# Patient Record
Sex: Male | Born: 1960 | Race: Black or African American | Hispanic: No | Marital: Married | Smoking: Never smoker
Health system: Southern US, Community
[De-identification: ages and names within clinical notes are randomized; demographics above are authoritative.]

## PROBLEM LIST (undated history)

## (undated) ENCOUNTER — Emergency Department (HOSPITAL_BASED_OUTPATIENT_CLINIC_OR_DEPARTMENT_OTHER)
Admission: EM | Disposition: A | Discharge status: Home / Self Care | Payer: Self-pay | Attending: Emergency Medicine | Admitting: Emergency Medicine

## (undated) DIAGNOSIS — I10 Essential (primary) hypertension: Secondary | ICD-10-CM

## (undated) DIAGNOSIS — F329 Major depressive disorder, single episode, unspecified: Secondary | ICD-10-CM

## (undated) DIAGNOSIS — N200 Calculus of kidney: Secondary | ICD-10-CM

## (undated) DIAGNOSIS — Z87828 Personal history of other (healed) physical injury and trauma: Secondary | ICD-10-CM

## (undated) DIAGNOSIS — D214 Benign neoplasm of connective and other soft tissue of abdomen: Secondary | ICD-10-CM

## (undated) DIAGNOSIS — K5790 Diverticulosis of intestine, part unspecified, without perforation or abscess without bleeding: Secondary | ICD-10-CM

## (undated) DIAGNOSIS — F32A Depression, unspecified: Secondary | ICD-10-CM

## (undated) DIAGNOSIS — I82409 Acute embolism and thrombosis of unspecified deep veins of unspecified lower extremity: Secondary | ICD-10-CM

## (undated) HISTORY — PX: BOWEL RESECTION: SHX1257

## (undated) HISTORY — PX: STOMACH SURGERY: SHX791

---

## 2004-07-14 ENCOUNTER — Emergency Department (HOSPITAL_COMMUNITY): Admission: EM | Admit: 2004-07-14 | Discharge: 2004-07-14 | Payer: Self-pay | Admitting: Emergency Medicine

## 2004-07-28 ENCOUNTER — Emergency Department (HOSPITAL_COMMUNITY): Admission: EM | Admit: 2004-07-28 | Discharge: 2004-07-28 | Payer: Self-pay | Admitting: Emergency Medicine

## 2004-08-07 ENCOUNTER — Emergency Department (HOSPITAL_COMMUNITY): Admission: EM | Admit: 2004-08-07 | Discharge: 2004-08-07 | Payer: Self-pay | Admitting: Emergency Medicine

## 2004-08-13 ENCOUNTER — Emergency Department: Payer: Self-pay | Admitting: Unknown Physician Specialty

## 2004-08-13 ENCOUNTER — Emergency Department (HOSPITAL_COMMUNITY): Admission: EM | Admit: 2004-08-13 | Discharge: 2004-08-13 | Payer: Self-pay | Admitting: Emergency Medicine

## 2004-08-15 ENCOUNTER — Emergency Department: Payer: Self-pay | Admitting: Emergency Medicine

## 2004-08-16 ENCOUNTER — Emergency Department (HOSPITAL_COMMUNITY): Admission: EM | Admit: 2004-08-16 | Discharge: 2004-08-16 | Payer: Self-pay | Admitting: Emergency Medicine

## 2007-04-28 ENCOUNTER — Emergency Department (HOSPITAL_COMMUNITY): Admission: EM | Admit: 2007-04-28 | Discharge: 2007-04-28 | Payer: Self-pay | Admitting: Emergency Medicine

## 2007-10-27 ENCOUNTER — Emergency Department (HOSPITAL_BASED_OUTPATIENT_CLINIC_OR_DEPARTMENT_OTHER): Admission: EM | Admit: 2007-10-27 | Discharge: 2007-10-27 | Payer: Self-pay | Admitting: Emergency Medicine

## 2007-11-18 ENCOUNTER — Emergency Department (HOSPITAL_BASED_OUTPATIENT_CLINIC_OR_DEPARTMENT_OTHER): Admission: EM | Admit: 2007-11-18 | Discharge: 2007-11-18 | Payer: Self-pay | Admitting: Emergency Medicine

## 2007-12-08 ENCOUNTER — Emergency Department (HOSPITAL_BASED_OUTPATIENT_CLINIC_OR_DEPARTMENT_OTHER): Admission: EM | Admit: 2007-12-08 | Discharge: 2007-12-08 | Payer: Self-pay | Admitting: Emergency Medicine

## 2008-01-13 ENCOUNTER — Emergency Department (HOSPITAL_BASED_OUTPATIENT_CLINIC_OR_DEPARTMENT_OTHER): Admission: EM | Admit: 2008-01-13 | Discharge: 2008-01-13 | Payer: Self-pay | Admitting: Emergency Medicine

## 2008-01-16 ENCOUNTER — Emergency Department (HOSPITAL_BASED_OUTPATIENT_CLINIC_OR_DEPARTMENT_OTHER): Admission: EM | Admit: 2008-01-16 | Discharge: 2008-01-16 | Payer: Self-pay | Admitting: Emergency Medicine

## 2008-01-25 ENCOUNTER — Ambulatory Visit: Payer: Self-pay | Admitting: Physical Medicine & Rehabilitation

## 2008-03-01 ENCOUNTER — Encounter
Admission: RE | Admit: 2008-03-01 | Discharge: 2008-03-01 | Payer: Self-pay | Admitting: Physical Medicine & Rehabilitation

## 2008-03-09 ENCOUNTER — Emergency Department (HOSPITAL_BASED_OUTPATIENT_CLINIC_OR_DEPARTMENT_OTHER): Admission: EM | Admit: 2008-03-09 | Discharge: 2008-03-09 | Payer: Self-pay | Admitting: Emergency Medicine

## 2008-07-28 ENCOUNTER — Emergency Department (HOSPITAL_BASED_OUTPATIENT_CLINIC_OR_DEPARTMENT_OTHER): Admission: EM | Admit: 2008-07-28 | Discharge: 2008-07-28 | Payer: Self-pay | Admitting: Emergency Medicine

## 2008-08-23 ENCOUNTER — Ambulatory Visit: Payer: Self-pay | Admitting: Diagnostic Radiology

## 2008-08-23 ENCOUNTER — Emergency Department (HOSPITAL_BASED_OUTPATIENT_CLINIC_OR_DEPARTMENT_OTHER): Admission: EM | Admit: 2008-08-23 | Discharge: 2008-08-23 | Payer: Self-pay | Admitting: Emergency Medicine

## 2008-10-12 ENCOUNTER — Ambulatory Visit: Payer: Self-pay | Admitting: Diagnostic Radiology

## 2008-10-12 ENCOUNTER — Emergency Department (HOSPITAL_BASED_OUTPATIENT_CLINIC_OR_DEPARTMENT_OTHER): Admission: EM | Admit: 2008-10-12 | Discharge: 2008-10-12 | Payer: Self-pay | Admitting: Emergency Medicine

## 2008-11-03 ENCOUNTER — Emergency Department (HOSPITAL_BASED_OUTPATIENT_CLINIC_OR_DEPARTMENT_OTHER): Admission: EM | Admit: 2008-11-03 | Discharge: 2008-11-03 | Payer: Self-pay | Admitting: Emergency Medicine

## 2008-11-03 ENCOUNTER — Ambulatory Visit: Payer: Self-pay | Admitting: Diagnostic Radiology

## 2009-01-21 ENCOUNTER — Ambulatory Visit: Payer: Self-pay | Admitting: Diagnostic Radiology

## 2009-01-21 ENCOUNTER — Emergency Department (HOSPITAL_BASED_OUTPATIENT_CLINIC_OR_DEPARTMENT_OTHER): Admission: EM | Admit: 2009-01-21 | Discharge: 2009-01-21 | Payer: Self-pay | Admitting: Emergency Medicine

## 2009-02-27 ENCOUNTER — Emergency Department (HOSPITAL_BASED_OUTPATIENT_CLINIC_OR_DEPARTMENT_OTHER): Admission: EM | Admit: 2009-02-27 | Discharge: 2009-02-27 | Payer: Self-pay | Admitting: Emergency Medicine

## 2009-02-27 ENCOUNTER — Ambulatory Visit: Payer: Self-pay | Admitting: Interventional Radiology

## 2009-03-01 ENCOUNTER — Ambulatory Visit: Payer: Self-pay | Admitting: Diagnostic Radiology

## 2009-03-01 ENCOUNTER — Emergency Department (HOSPITAL_BASED_OUTPATIENT_CLINIC_OR_DEPARTMENT_OTHER): Admission: EM | Admit: 2009-03-01 | Discharge: 2009-03-01 | Payer: Self-pay | Admitting: Emergency Medicine

## 2009-03-14 ENCOUNTER — Emergency Department (HOSPITAL_BASED_OUTPATIENT_CLINIC_OR_DEPARTMENT_OTHER): Admission: EM | Admit: 2009-03-14 | Discharge: 2009-03-14 | Payer: Self-pay | Admitting: Emergency Medicine

## 2009-03-14 ENCOUNTER — Ambulatory Visit: Payer: Self-pay | Admitting: Diagnostic Radiology

## 2009-04-18 ENCOUNTER — Emergency Department (HOSPITAL_BASED_OUTPATIENT_CLINIC_OR_DEPARTMENT_OTHER): Admission: EM | Admit: 2009-04-18 | Discharge: 2009-04-18 | Payer: Self-pay | Admitting: Emergency Medicine

## 2009-04-19 ENCOUNTER — Ambulatory Visit: Payer: Self-pay | Admitting: Diagnostic Radiology

## 2009-04-19 ENCOUNTER — Emergency Department (HOSPITAL_BASED_OUTPATIENT_CLINIC_OR_DEPARTMENT_OTHER): Admission: EM | Admit: 2009-04-19 | Discharge: 2009-04-19 | Payer: Self-pay | Admitting: Emergency Medicine

## 2010-01-29 IMAGING — CR DG ABDOMEN ACUTE W/ 1V CHEST
4 series · 4 of 4 positions shown · non-contrast
Comparison: 10/12/2008

CLINICAL DATA: Lower abdominal pain.  Vomiting.

ACUTE ABDOMEN SERIES (ABDOMEN 2 VIEW & CHEST 1 VIEW)

[w chest pa]
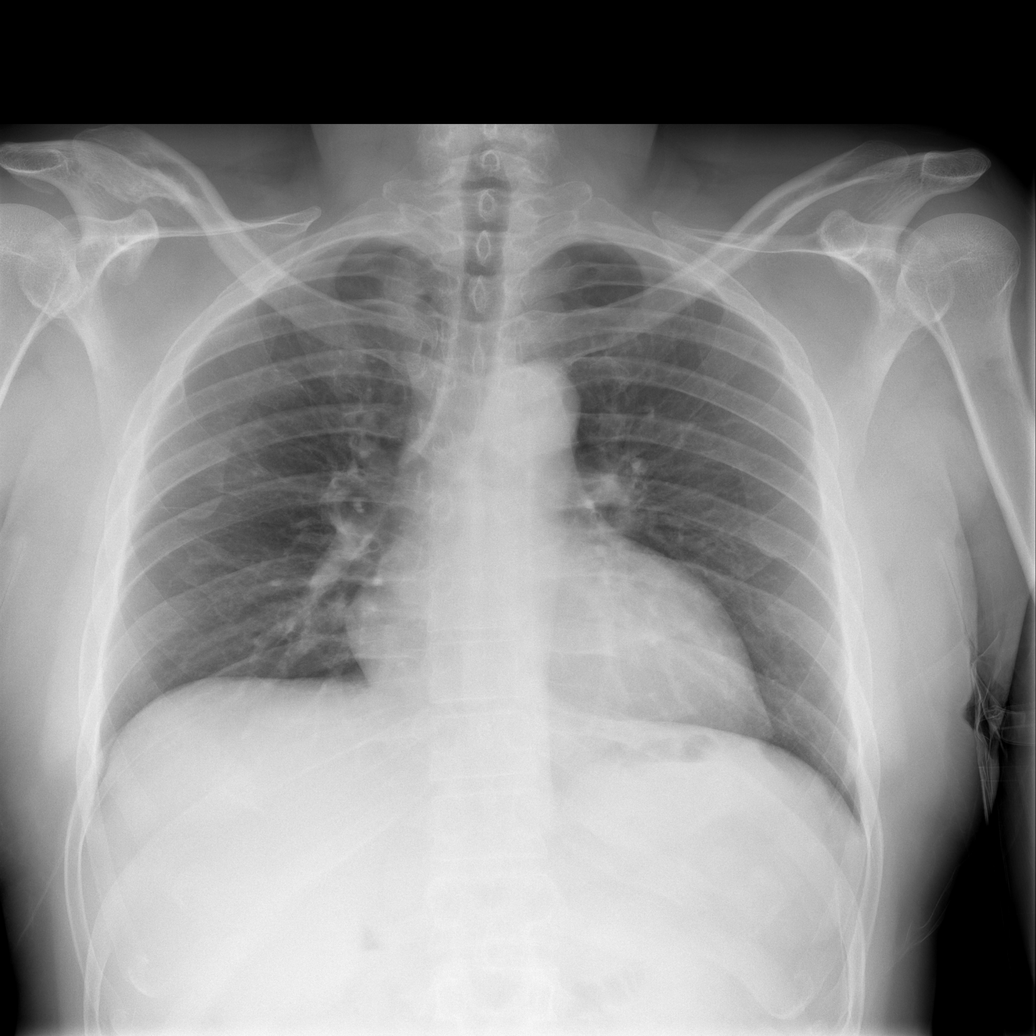

[w abdomen upright]
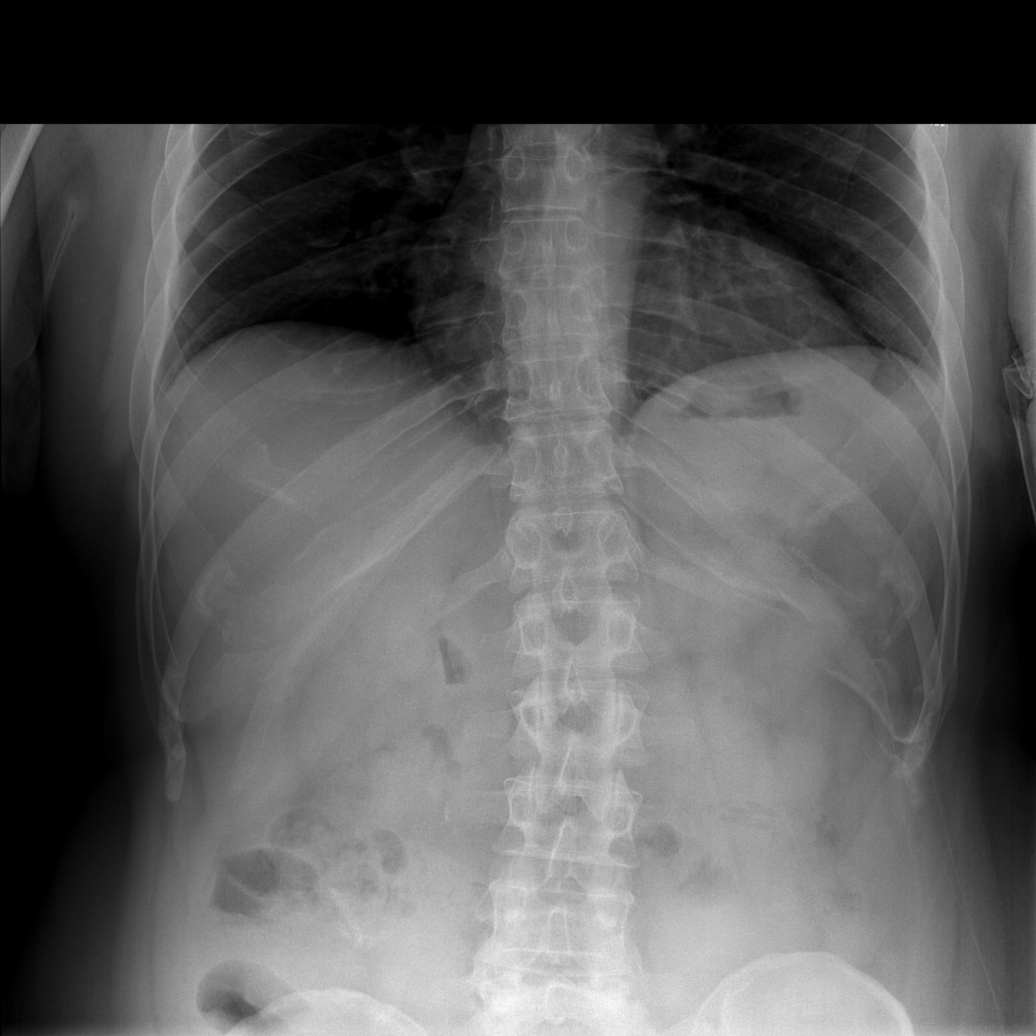

[t abdomen supine (1 of 2)]
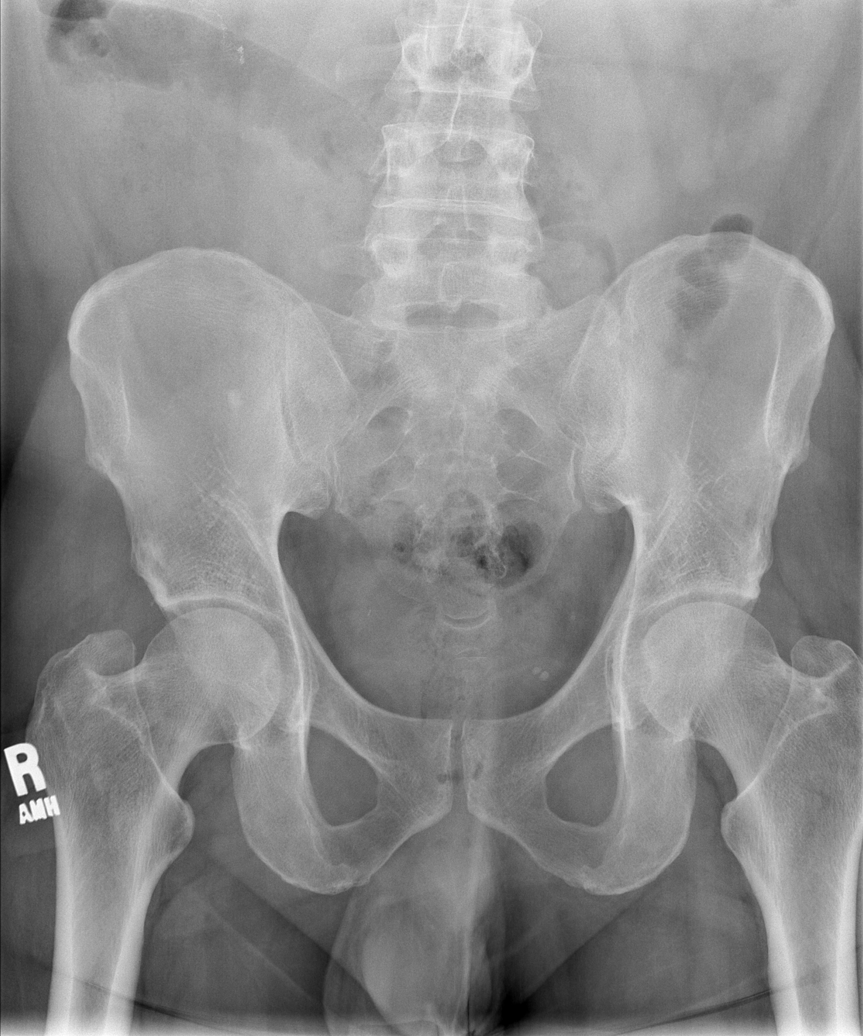

[t abdomen supine (2 of 2)]
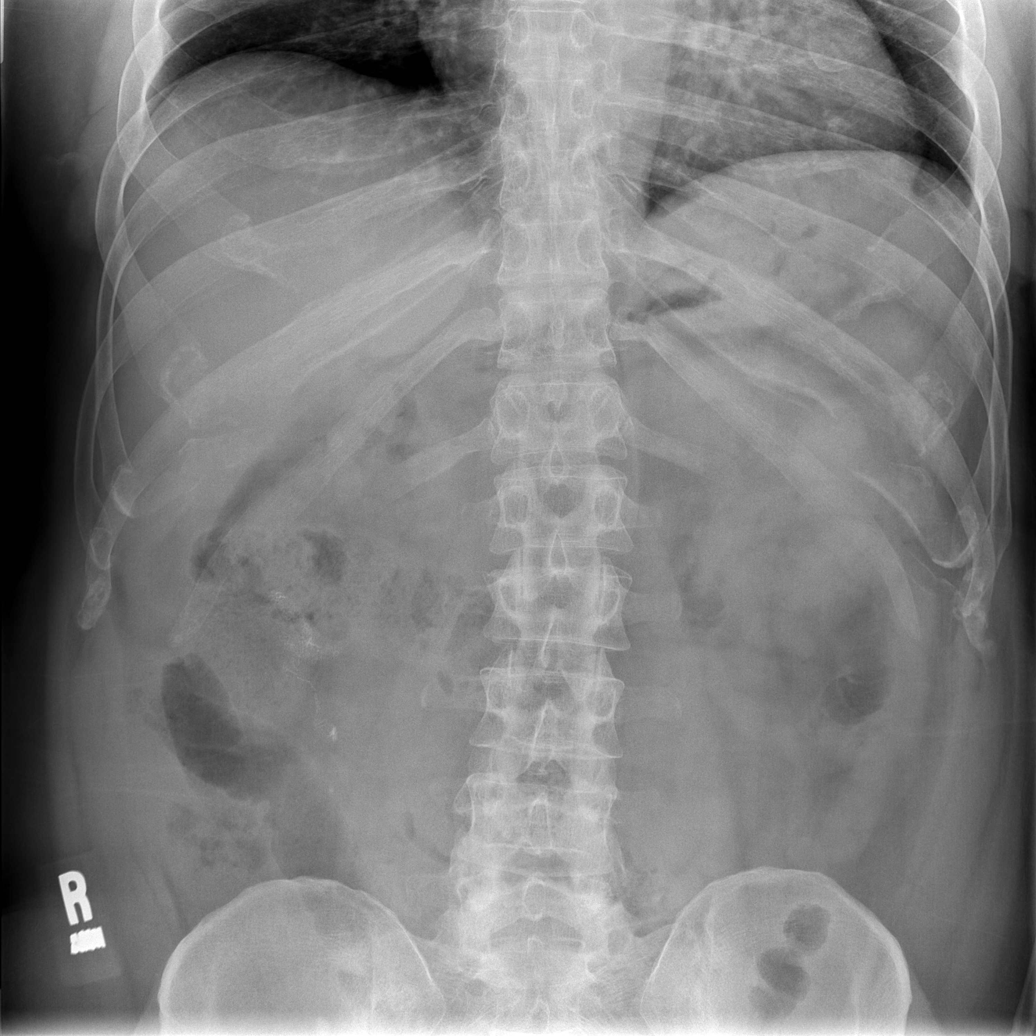

[4 of 4 positions shown; findings below may reference images not displayed]

FINDINGS: There is no evidence of dilated bowel loops or free
intraperitoneal air.  No radiopaque calculi or other significant
radiographic abnormality is seen. Heart size and mediastinal
contours are within normal limits.  Both lungs are clear.
IMPRESSION: Negative abdominal radiographs.  No acute cardiopulmonary disease.

## 2010-05-16 ENCOUNTER — Encounter: Payer: Self-pay | Admitting: Physical Medicine & Rehabilitation

## 2010-07-26 LAB — URINALYSIS, ROUTINE W REFLEX MICROSCOPIC
Bilirubin Urine: NEGATIVE
Nitrite: NEGATIVE
Specific Gravity, Urine: 1.013 (ref 1.005–1.030)
Urobilinogen, UA: 0.2 mg/dL (ref 0.0–1.0)
pH: 6.5 (ref 5.0–8.0)

## 2010-07-26 LAB — DIFFERENTIAL
Basophils Absolute: 0.2 10*3/uL — ABNORMAL HIGH (ref 0.0–0.1)
Basophils Relative: 3 % — ABNORMAL HIGH (ref 0–1)
Lymphocytes Relative: 23 % (ref 12–46)
Monocytes Absolute: 0.5 10*3/uL (ref 0.1–1.0)
Neutro Abs: 4.2 10*3/uL (ref 1.7–7.7)
Neutrophils Relative %: 62 % (ref 43–77)

## 2010-07-26 LAB — LIPASE, BLOOD: Lipase: 73 U/L (ref 23–300)

## 2010-07-26 LAB — URINE CULTURE

## 2010-07-26 LAB — COMPREHENSIVE METABOLIC PANEL
Albumin: 4.4 g/dL (ref 3.5–5.2)
Alkaline Phosphatase: 76 U/L (ref 39–117)
BUN: 9 mg/dL (ref 6–23)
CO2: 29 mEq/L (ref 19–32)
Chloride: 104 mEq/L (ref 96–112)
Creatinine, Ser: 1.1 mg/dL (ref 0.4–1.5)
GFR calc non Af Amer: >60 mL/min (ref >60)
Glucose, Bld: 91 mg/dL (ref 70–99)
Potassium: 4.7 mEq/L (ref 3.5–5.1)
Total Bilirubin: 0.6 mg/dL (ref 0.3–1.2)

## 2010-07-26 LAB — CBC
HCT: 39 % (ref 39.0–52.0)
Hemoglobin: 12.5 g/dL — ABNORMAL LOW (ref 13.0–17.0)
MCV: 77.1 fL — ABNORMAL LOW (ref 78.0–100.0)
WBC: 6.7 10*3/uL (ref 4.0–10.5)

## 2010-07-28 LAB — DIFFERENTIAL
Basophils Absolute: 0.2 10*3/uL — ABNORMAL HIGH (ref 0.0–0.1)
Basophils Relative: 3 % — ABNORMAL HIGH (ref 0–1)
Neutro Abs: 4.6 10*3/uL (ref 1.7–7.7)
Neutrophils Relative %: 62 % (ref 43–77)

## 2010-07-28 LAB — COMPREHENSIVE METABOLIC PANEL
Alkaline Phosphatase: 85 U/L (ref 39–117)
BUN: 11 mg/dL (ref 6–23)
Chloride: 102 mEq/L (ref 96–112)
Creatinine, Ser: 1.1 mg/dL (ref 0.4–1.5)
Glucose, Bld: 97 mg/dL (ref 70–99)
Potassium: 4 mEq/L (ref 3.5–5.1)
Total Bilirubin: 0.7 mg/dL (ref 0.3–1.2)

## 2010-07-28 LAB — CBC
HCT: 36.4 % — ABNORMAL LOW (ref 39.0–52.0)
Hemoglobin: 11.7 g/dL — ABNORMAL LOW (ref 13.0–17.0)
MCV: 77.4 fL — ABNORMAL LOW (ref 78.0–100.0)
RDW: 17.1 % — ABNORMAL HIGH (ref 11.5–15.5)
WBC: 7.4 10*3/uL (ref 4.0–10.5)

## 2010-07-28 LAB — LIPASE, BLOOD: Lipase: 54 U/L (ref 23–300)

## 2010-08-01 LAB — COMPREHENSIVE METABOLIC PANEL
ALT: 4 U/L (ref 0–53)
AST: 45 U/L — ABNORMAL HIGH (ref 0–37)
CO2: 24 mEq/L (ref 19–32)
Calcium: 9.3 mg/dL (ref 8.4–10.5)
Chloride: 106 mEq/L (ref 96–112)
GFR calc Af Amer: >60 mL/min (ref >60)
GFR calc non Af Amer: >60 mL/min (ref >60)
Potassium: 5.3 mEq/L — ABNORMAL HIGH (ref 3.5–5.1)
Sodium: 140 mEq/L (ref 135–145)

## 2010-08-01 LAB — DIFFERENTIAL
Eosinophils Absolute: 0.1 10*3/uL (ref 0.0–0.7)
Eosinophils Relative: 1 % (ref 0–5)
Lymphs Abs: 1.4 10*3/uL (ref 0.7–4.0)

## 2010-08-01 LAB — LIPASE, BLOOD: Lipase: 82 U/L (ref 23–300)

## 2010-08-01 LAB — URINALYSIS, ROUTINE W REFLEX MICROSCOPIC
Bilirubin Urine: NEGATIVE
Glucose, UA: NEGATIVE mg/dL
Ketones, ur: NEGATIVE mg/dL
Nitrite: NEGATIVE
pH: 6 (ref 5.0–8.0)

## 2010-08-01 LAB — URINE CULTURE: Colony Count: NO GROWTH

## 2010-08-01 LAB — CBC
RBC: 5.15 MIL/uL (ref 4.22–5.81)
WBC: 11.5 10*3/uL — ABNORMAL HIGH (ref 4.0–10.5)

## 2010-08-02 LAB — URINALYSIS, ROUTINE W REFLEX MICROSCOPIC
Bilirubin Urine: NEGATIVE
Glucose, UA: NEGATIVE mg/dL
Hgb urine dipstick: NEGATIVE
Ketones, ur: NEGATIVE mg/dL
Protein, ur: NEGATIVE mg/dL

## 2010-08-02 LAB — URINE MICROSCOPIC-ADD ON

## 2010-08-02 LAB — COMPREHENSIVE METABOLIC PANEL
ALT: 25 U/L (ref 0–53)
Alkaline Phosphatase: 119 U/L — ABNORMAL HIGH (ref 39–117)
BUN: 10 mg/dL (ref 6–23)
CO2: 27 mEq/L (ref 19–32)
Chloride: 108 mEq/L (ref 96–112)
Glucose, Bld: 113 mg/dL — ABNORMAL HIGH (ref 70–99)
Potassium: 4.4 mEq/L (ref 3.5–5.1)
Total Bilirubin: 0.8 mg/dL (ref 0.3–1.2)

## 2010-08-02 LAB — DIFFERENTIAL
Basophils Absolute: 0.1 10*3/uL (ref 0.0–0.1)
Basophils Relative: 2 % — ABNORMAL HIGH (ref 0–1)
Eosinophils Absolute: 0.2 10*3/uL (ref 0.0–0.7)
Neutro Abs: 6 10*3/uL (ref 1.7–7.7)
Neutrophils Relative %: 72 % (ref 43–77)

## 2010-08-02 LAB — CBC
HCT: 40.9 % (ref 39.0–52.0)
Hemoglobin: 12.9 g/dL — ABNORMAL LOW (ref 13.0–17.0)
RBC: 5.31 MIL/uL (ref 4.22–5.81)
WBC: 8.3 10*3/uL (ref 4.0–10.5)

## 2010-08-03 LAB — DIFFERENTIAL
Eosinophils Absolute: 0.2 10*3/uL (ref 0.0–0.7)
Eosinophils Relative: 2 % (ref 0–5)
Lymphs Abs: 1.6 10*3/uL (ref 0.7–4.0)
Monocytes Absolute: 0.4 10*3/uL (ref 0.1–1.0)
Monocytes Relative: 4 % (ref 3–12)
Neutro Abs: 6.9 10*3/uL (ref 1.7–7.7)
Neutrophils Relative %: 73 % (ref 43–77)

## 2010-08-03 LAB — URINALYSIS, ROUTINE W REFLEX MICROSCOPIC
Ketones, ur: NEGATIVE mg/dL
Protein, ur: NEGATIVE mg/dL
Urobilinogen, UA: 0.2 mg/dL (ref 0.0–1.0)

## 2010-08-03 LAB — CBC
HCT: 40.9 % (ref 39.0–52.0)
Hemoglobin: 12.9 g/dL — ABNORMAL LOW (ref 13.0–17.0)
MCV: 78.8 fL (ref 78.0–100.0)
Platelets: 316 10*3/uL (ref 150–400)
WBC: 9.6 10*3/uL (ref 4.0–10.5)

## 2010-08-03 LAB — COMPREHENSIVE METABOLIC PANEL
Albumin: 4.5 g/dL (ref 3.5–5.2)
Alkaline Phosphatase: 109 U/L (ref 39–117)
BUN: 11 mg/dL (ref 6–23)
CO2: 25 mEq/L (ref 19–32)
Chloride: 105 mEq/L (ref 96–112)
Creatinine, Ser: 1.1 mg/dL (ref 0.4–1.5)
GFR calc non Af Amer: >60 mL/min (ref >60)
Glucose, Bld: 112 mg/dL — ABNORMAL HIGH (ref 70–99)
Potassium: 4 mEq/L (ref 3.5–5.1)
Total Bilirubin: 0.7 mg/dL (ref 0.3–1.2)

## 2010-08-03 LAB — URINE MICROSCOPIC-ADD ON

## 2010-09-07 NOTE — Group Therapy Note (Signed)
INITIAL PATIENT PHYSICAL MEDICINE AND REHABILITATION EVALUATION   REFERRING PHYSICIAN:  The patient referred from the ED.   HISTORY:  A 50 year old male who has chief complaint of low back pain  without lower extremity pain.   HISTORY OF PRESENT ILLNESS:  A 50 year old male who fell [place]  [place] in 2002 sustaining an L2 and L3 compression fracture.  He  was seen at [facility] in [facility].  He did not have  any physical therapy.  He did have some type of injection at [facility]  as well as some medications.  He was told that he had a compression  fracture that would get better with time and that no further treatment  was really needed.  He did relatively well until 2006, when he states he  was messing around at a cookout, when he hurt his back again.  At that  time, he was seen in the emergency room at [facility] on several  occasions for back injury; this was on July 14, 2004, given  hydrocodone, ibuprofen, and cyclobenzaprine.  Seen again on July 28, 2004, for the same complaints, given ibuprofen and Vicodin, told to  follow up with Orthopedics if needed, which he did not do.  He once  again was seen in the ED on August 07, 2004 for the same complaints, but  having some sciatic-type symptoms as well, and again on August 13, 2004  for the same complaints, received some Toradol as well as oxycodone.  He  had one more visit in April 2006, once again receiving some hydrocodone.  He then did relatively better for a couple of years until he was seen in  the ED again on April 28, 2007 with back pain and muscle spasms.  He  states that he missed a curb and jarred his back while stepping down,  was given ibuprofen and Valium, and told to follow up with his primary  care physician.  He then got better, but on July 4, was doing a lot of  work and went to the ED next day and received hydrocodone.  Seen again  on the 26th, had some pain in the left foot at that  time, which was not  clearly related to his back.  He then was in the ED once again on December 08, 2007; January 13, 2008; and January 16, 2008 for his back and  received ibuprofen and oxycodone at those visits as well.  He had an x-  ray showing an old L2 compression fracture superior aspect.  Possible  bilateral nephrolithiasis.  He did have some moderate lower lumbar facet  hypertrophy.   Advised to come to the Pain Clinic as well as Orthopedics.   He did keep this appointment with Ortho.   His pain is currently 8/10, described as aching in the mid and lower  back area.  His pain interferes with activity at an 8/10 level and  enjoyment of life at a 6-9/10 level.  This pain is worse during the  morning, evening, and nighttime hours.  Sleep is fair.  Sometimes, his  pain wakes him up.  Relief from meds is good.  He is able to climb  steps.  He is able to drive.  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   REVIEW OF SYSTEMS:  Positive for weakness, but negative for bowel or  bladder dysfunction.  His Oswestry Disability Index was  completed today,  is 64%.   CURRENT MEDICATIONS INCLUDE:  1. Lidocaine patch, which is not particularly helpful.  2. Percocet 5 mg 1-2 every q.4-6 h.  3. Vicodin 5/325 one to two p.o. q.4-6 h.  He is really not taking      them that often, however.   I did review his ED visits more recently in [facility] as well as his  previous ones as well as his lumbar films.   SOCIAL HISTORY:  Married, has a college-age daughter and 2 other  children, one 76-year-old granddaughter, who lives at the house.   PAST MEDICAL HISTORY:  Significant for hypertension.  In fact, his blood  pressure has been up in the ED as well as in my office today.   FAMILY HISTORY:  Positive for cancer.  Negative for alcohol or drug  abuse.   The patient also denies personal history of alcohol or drug abuse.   PHYSICAL EXAMINATION:  VITAL SIGNS:   Blood pressure 179/109, pulse 77,  and weight 223 pounds.  GENERAL:  No acute distress.  Mood and affect appropriate.  His right  eye appears enucleated.  He at 20 accidentally hit him as a child.  EXTREMITIES:  No evidence of edema.  Coordination is normal.  Deep  tendon reflexes are normal.  Sensation is normal in the upper and lower  extremities.  His range of motion and strength in the upper and lower  extremities is normal.  He has normal tone.  No evidence of tremor.  NECK:  No tenderness to palpation.  His neck range of motion is full.  His lumbar spine range of motion reduced to about 25% forward flexion  and 50% extension.  Flexion appears to be more painful than extension.  He has tenderness to palpation starting around the L2 paraspinals and  extending down into the L4-L5 area.  His Faber maneuver is negative.   IMPRESSION:  Lumbar pain mainly axial with flexion with prior history of  compression fracture, question whether this a new compression fracture  versus diskogenic problems versus facet-mediated pain.   There is no evidence of significant radicular component.  There are no  red flags.  His straight leg raising test is negative.   RECOMMENDATIONS:  Further workup is needed including MRI of the lumbar  spine.  We will review with him after he has this done in the next 1-2  weeks.  We will see whether he has evidence of a new compression  fracture or disk herniation.   Discussed the treatment options, which depend on etiology of pain.  These range from vertebroplasty to physical therapy, medications, and  injections.   I spent time going over the spine model and explaining the various  etiologies of his pain.   I have given him prescription for tramadol 50 mg 1-2 p.o. t.i.d.  We  will check a urine drug screen, and if the tramadol is not adequately  controlling his pain, consider adding hydrocodone or oxycodone more on  an intermittent basis or at least to help him  through this current flare-  up.      Erick Colace, M.D.  Electronically Signed     AEK/MedQ  D:  01/25/2008 10:41:18  T:  01/26/2008 00:29:34  Job #:  [id number]   cc:   Jackie Plum, M.D.  Fax: [phone]   [name], M.D.  Fax: [phone]

## 2010-09-20 ENCOUNTER — Emergency Department (HOSPITAL_BASED_OUTPATIENT_CLINIC_OR_DEPARTMENT_OTHER)
Admission: EM | Admit: 2010-09-20 | Discharge: 2010-09-20 | Disposition: A | Discharge status: Home / Self Care | Payer: Self-pay | Attending: Emergency Medicine | Admitting: Emergency Medicine

## 2010-09-20 DIAGNOSIS — G43909 Migraine, unspecified, not intractable, without status migrainosus: Secondary | ICD-10-CM | POA: Insufficient documentation

## 2010-09-20 DIAGNOSIS — I10 Essential (primary) hypertension: Secondary | ICD-10-CM | POA: Insufficient documentation

## 2010-10-28 ENCOUNTER — Emergency Department (HOSPITAL_BASED_OUTPATIENT_CLINIC_OR_DEPARTMENT_OTHER)
Admission: EM | Admit: 2010-10-28 | Discharge: 2010-10-28 | Disposition: A | Discharge status: Home / Self Care | Payer: Self-pay | Attending: Emergency Medicine | Admitting: Emergency Medicine

## 2010-10-28 ENCOUNTER — Emergency Department (INDEPENDENT_AMBULATORY_CARE_PROVIDER_SITE_OTHER): Payer: Self-pay

## 2010-10-28 DIAGNOSIS — S6000XA Contusion of unspecified finger without damage to nail, initial encounter: Secondary | ICD-10-CM | POA: Insufficient documentation

## 2010-10-28 DIAGNOSIS — Y9289 Other specified places as the place of occurrence of the external cause: Secondary | ICD-10-CM | POA: Insufficient documentation

## 2010-10-28 DIAGNOSIS — M25549 Pain in joints of unspecified hand: Secondary | ICD-10-CM

## 2010-10-28 DIAGNOSIS — IMO0002 Reserved for concepts with insufficient information to code with codable children: Secondary | ICD-10-CM

## 2010-10-28 DIAGNOSIS — W208XXA Other cause of strike by thrown, projected or falling object, initial encounter: Secondary | ICD-10-CM | POA: Insufficient documentation

## 2010-11-10 ENCOUNTER — Encounter: Payer: Self-pay | Admitting: *Deleted

## 2010-11-10 ENCOUNTER — Emergency Department (INDEPENDENT_AMBULATORY_CARE_PROVIDER_SITE_OTHER): Payer: Self-pay

## 2010-11-10 ENCOUNTER — Emergency Department (HOSPITAL_BASED_OUTPATIENT_CLINIC_OR_DEPARTMENT_OTHER)
Admission: EM | Admit: 2010-11-10 | Discharge: 2010-11-10 | Disposition: A | Discharge status: Home / Self Care | Payer: Self-pay | Attending: Emergency Medicine | Admitting: Emergency Medicine

## 2010-11-10 DIAGNOSIS — M545 Low back pain, unspecified: Secondary | ICD-10-CM | POA: Insufficient documentation

## 2010-11-10 DIAGNOSIS — N200 Calculus of kidney: Secondary | ICD-10-CM

## 2010-11-10 DIAGNOSIS — R109 Unspecified abdominal pain: Secondary | ICD-10-CM

## 2010-11-10 DIAGNOSIS — R319 Hematuria, unspecified: Secondary | ICD-10-CM

## 2010-11-10 DIAGNOSIS — M549 Dorsalgia, unspecified: Secondary | ICD-10-CM

## 2010-11-10 DIAGNOSIS — N289 Disorder of kidney and ureter, unspecified: Secondary | ICD-10-CM

## 2010-11-10 HISTORY — DX: Calculus of kidney: N20.0

## 2010-11-10 HISTORY — DX: Essential (primary) hypertension: I10

## 2010-11-10 LAB — URINE MICROSCOPIC-ADD ON

## 2010-11-10 LAB — URINALYSIS, ROUTINE W REFLEX MICROSCOPIC
Bilirubin Urine: NEGATIVE
Glucose, UA: NEGATIVE mg/dL
Ketones, ur: NEGATIVE mg/dL
pH: 5.5 (ref 5.0–8.0)

## 2010-11-10 MED ORDER — HYDROCODONE-ACETAMINOPHEN 5-325 MG PO TABS: 1.0000 | ORAL_TABLET | Freq: Four times a day (QID) | ORAL | Status: AC | PRN

## 2010-11-10 MED ORDER — HYDROCODONE-ACETAMINOPHEN 5-325 MG PO TABS
2.0000 | ORAL_TABLET | Freq: Once | ORAL | Status: AC
  Administered 2010-11-10: 2 via ORAL
  Filled 2010-11-10: qty 2

## 2010-11-10 NOTE — ED Notes (Signed)
Pt reports hx of chronic back pain x months. States pain worse over last 3 days. Reports left flank pain. Reports blood in urine yesterday.

## 2010-11-10 NOTE — ED Provider Notes (Signed)
History     Chief Complaint  Patient presents with  . Back Pain   Patient is a 50 y.o. male presenting with back pain. The history is provided by the patient.  Back Pain  This is a chronic problem. The problem occurs constantly. The pain is associated with no known injury. The pain is present in the lumbar spine. The pain does not radiate. The pain is severe. The symptoms are aggravated by certain positions. The pain is the same all the time. Pertinent negatives include no chest pain, no fever, no numbness, no headaches, no abdominal pain, no bladder incontinence, no paresthesias and no weakness. He has tried NSAIDs for the symptoms. The treatment provided mild relief.    Past Medical History  Diagnosis Date  . Kidney stones   . High blood pressure     Past Surgical History  Procedure Date  . Stomach surgery     No family history on file.  History  Substance Use Topics  . Smoking status: Never Smoker   . Smokeless tobacco: Not on file  . Alcohol Use: No      Review of Systems  Constitutional: Negative for fever.  HENT: Negative for neck pain.   Respiratory: Negative for shortness of breath.   Cardiovascular: Negative for chest pain.  Gastrointestinal: Negative for vomiting and abdominal pain.  Genitourinary: Negative for bladder incontinence.  Musculoskeletal: Positive for back pain.  Skin: Negative for rash.  Neurological: Negative for weakness, numbness, headaches and paresthesias.  Hematological: Does not bruise/bleed easily.  Psychiatric/Behavioral: Negative for behavioral problems.  All other systems reviewed and are negative.    Physical Exam  BP 167/106  Pulse 81  Temp(Src) 98.8 F (37.1 C) (Oral)  Resp 16  Ht 6' (1.829 m)  Wt 232 lb (105.235 kg)  BMI 31.47 kg/m2  SpO2 100%  Physical Exam  Nursing note and vitals reviewed. Constitutional: He is oriented to person, place, and time. He appears well-developed and well-nourished. No distress.  HENT:    Head: Atraumatic.  Eyes: Pupils are equal, round, and reactive to light.  Neck: Neck supple. No tracheal deviation present.  Cardiovascular: Normal rate.   Pulmonary/Chest: Effort normal. No accessory muscle usage. No respiratory distress.  Abdominal: He exhibits no distension and no mass. There is no tenderness. There is no rebound, no guarding and no CVA tenderness.  Musculoskeletal: Normal range of motion.       Spine nt. Lumbar muscle tenderness. No cva tenderness  Neurological: He is alert and oriented to person, place, and time.       Motor intact bil ext, normal reflexes  Skin: Skin is warm and dry.  Psychiatric: He has a normal mood and affect.    ED Course  Procedures  MDM Labs, pt says has ride, did not drive. No meds pta. Nkda. Hydrocodone po.   Pt comfortable, discussed ct.     Suzi Roots, MD 11/10/10 [phone]

## 2010-11-11 DIAGNOSIS — Z0389 Encounter for observation for other suspected diseases and conditions ruled out: Secondary | ICD-10-CM | POA: Insufficient documentation

## 2010-12-08 ENCOUNTER — Encounter (HOSPITAL_BASED_OUTPATIENT_CLINIC_OR_DEPARTMENT_OTHER): Payer: Self-pay | Admitting: *Deleted

## 2010-12-08 ENCOUNTER — Emergency Department (HOSPITAL_BASED_OUTPATIENT_CLINIC_OR_DEPARTMENT_OTHER)
Admission: EM | Admit: 2010-12-08 | Discharge: 2010-12-08 | Disposition: A | Discharge status: Home / Self Care | Payer: Self-pay | Attending: Emergency Medicine | Admitting: Emergency Medicine

## 2010-12-08 DIAGNOSIS — M25519 Pain in unspecified shoulder: Secondary | ICD-10-CM | POA: Insufficient documentation

## 2010-12-08 DIAGNOSIS — M67919 Unspecified disorder of synovium and tendon, unspecified shoulder: Secondary | ICD-10-CM | POA: Insufficient documentation

## 2010-12-08 DIAGNOSIS — I10 Essential (primary) hypertension: Secondary | ICD-10-CM | POA: Insufficient documentation

## 2010-12-08 DIAGNOSIS — R29898 Other symptoms and signs involving the musculoskeletal system: Secondary | ICD-10-CM | POA: Insufficient documentation

## 2010-12-08 DIAGNOSIS — M75102 Unspecified rotator cuff tear or rupture of left shoulder, not specified as traumatic: Secondary | ICD-10-CM

## 2010-12-08 DIAGNOSIS — M719 Bursopathy, unspecified: Secondary | ICD-10-CM | POA: Insufficient documentation

## 2010-12-08 MED ORDER — HYDROCODONE-ACETAMINOPHEN 5-325 MG PO TABS
1.0000 | ORAL_TABLET | Freq: Once | ORAL | Status: AC
  Administered 2010-12-08: 1 via ORAL
  Filled 2010-12-08: qty 1

## 2010-12-08 MED ORDER — IBUPROFEN 800 MG PO TABS: 800.0000 mg | ORAL_TABLET | Freq: Three times a day (TID) | ORAL | Status: AC

## 2010-12-08 MED ORDER — HYDROCODONE-ACETAMINOPHEN 5-325 MG PO TABS: 1.0000 | ORAL_TABLET | ORAL | Status: AC | PRN

## 2010-12-08 NOTE — ED Notes (Signed)
States he thinks he has a pinched nerve. C.o pain in his left shoulder and arm. Guarding. Has been taking Motrin and ultram without relief.

## 2010-12-08 NOTE — ED Provider Notes (Signed)
History     CSN: [id number] Arrival date & time: 12/08/2010  8:06 PM  Chief Complaint  Patient presents with  . Shoulder Pain   HPI Pt c/o left shoulder pain for the last few days.  Denies trauma but does a lot of heavy lifting at home.  Aggravated by ROM. Associated w/ LUE weakness; no paresthesias.  Denies neck pain.  Denies fever.  Past Medical History  Diagnosis Date  . Kidney stones   . High blood pressure     Past Surgical History  Procedure Date  . Stomach surgery     No family history on file.  History  Substance Use Topics  . Smoking status: Never Smoker   . Smokeless tobacco: Not on file  . Alcohol Use: No      Review of Systems  All other systems reviewed and are negative.    Physical Exam  BP 180/97  Pulse 115  Temp(Src) 98.4 F (36.9 C) (Oral)  Resp 20  SpO2 98%  Physical Exam  Nursing note and vitals reviewed. Constitutional: He is oriented to person, place, and time. He appears well-developed and well-nourished. No distress.  HENT:  Head: Normocephalic and atraumatic.  Eyes:       Normal appearance  Neck: Normal range of motion.  Musculoskeletal:       Left shoulder w/out deformity or skin changes.  Tenderness of anterior shoulder, proximal upper arm and left trapezius.  Passive flexion/abduction limited by pain.  Can not hold arm in shoulder flexion/abduction against resistance.  Nml elbow and wrist exams.  Distal NV intact.   Neurological: He is alert and oriented to person, place, and time.  Psychiatric: He has a normal mood and affect. His behavior is normal.    ED Course  Procedures  MDM Pt presents w/ non-traumatic left shoulder pain.  Heavy lifting at work.  No signs of joint infection on exam.  S/Sx most consistent w/ rotator cuff syndrome or cervical/shoulder sprain.  CNA placed in sling for comfort.  Conservative therapy discussed and pt discharged home w/ pain medication and referral to ortho.       Arie Sabina  [place], PA 12/08/10 4098  Hilario Quarry, MD 12/15/10 [phone]

## 2011-02-18 ENCOUNTER — Encounter (HOSPITAL_BASED_OUTPATIENT_CLINIC_OR_DEPARTMENT_OTHER): Payer: Self-pay | Admitting: *Deleted

## 2011-02-18 ENCOUNTER — Emergency Department (INDEPENDENT_AMBULATORY_CARE_PROVIDER_SITE_OTHER): Payer: Self-pay

## 2011-02-18 ENCOUNTER — Emergency Department (HOSPITAL_BASED_OUTPATIENT_CLINIC_OR_DEPARTMENT_OTHER)
Admission: EM | Admit: 2011-02-18 | Discharge: 2011-02-18 | Disposition: A | Discharge status: Home / Self Care | Payer: Self-pay | Attending: Emergency Medicine | Admitting: Emergency Medicine

## 2011-02-18 DIAGNOSIS — M79609 Pain in unspecified limb: Secondary | ICD-10-CM

## 2011-02-18 DIAGNOSIS — I10 Essential (primary) hypertension: Secondary | ICD-10-CM | POA: Insufficient documentation

## 2011-02-18 DIAGNOSIS — F329 Major depressive disorder, single episode, unspecified: Secondary | ICD-10-CM | POA: Insufficient documentation

## 2011-02-18 DIAGNOSIS — M25579 Pain in unspecified ankle and joints of unspecified foot: Secondary | ICD-10-CM | POA: Insufficient documentation

## 2011-02-18 DIAGNOSIS — F3289 Other specified depressive episodes: Secondary | ICD-10-CM | POA: Insufficient documentation

## 2011-02-18 DIAGNOSIS — Z86718 Personal history of other venous thrombosis and embolism: Secondary | ICD-10-CM | POA: Insufficient documentation

## 2011-02-18 DIAGNOSIS — R609 Edema, unspecified: Secondary | ICD-10-CM

## 2011-02-18 HISTORY — DX: Major depressive disorder, single episode, unspecified: F32.9

## 2011-02-18 HISTORY — DX: Depression, unspecified: F32.A

## 2011-02-18 HISTORY — DX: Acute embolism and thrombosis of unspecified deep veins of unspecified lower extremity: I82.409

## 2011-02-18 MED ORDER — HYDROCODONE-ACETAMINOPHEN 5-325 MG PO TABS: 2.0000 | ORAL_TABLET | ORAL | Status: AC | PRN

## 2011-02-18 MED ORDER — HYDROCODONE-ACETAMINOPHEN 5-325 MG PO TABS
2.0000 | ORAL_TABLET | Freq: Once | ORAL | Status: AC
  Administered 2011-02-18: 2 via ORAL
  Filled 2011-02-18: qty 2

## 2011-02-18 NOTE — ED Provider Notes (Addendum)
History     CSN: [id number] Arrival date & time: 02/18/2011  8:35 AM   First MD Initiated Contact with Patient 02/18/11 0930      Chief Complaint  Patient presents with  . Foot Pain    left    (Consider location/radiation/quality/duration/timing/severity/associated sxs/prior treatment) Patient is a 50 y.o. male presenting with lower extremity pain.  Foot Pain   Presents with left foot pain for 2 days constant at dorsum of foot radiates to left calf with swelling of left foot. Treated with Tramadoll, with no relief pain is worse with weightbearing. No fever no injury no other complaint Past Medical History  Diagnosis Date  . Kidney stones   . High blood pressure   . Gout   . DVT (deep venous thrombosis)   . Migraine   . Depression     Past Surgical History  Procedure Date  . Stomach surgery    bunionectomy left foot  History reviewed. No pertinent family history.  History  Substance Use Topics  . Smoking status: Never Smoker   . Smokeless tobacco: Not on file  . Alcohol Use: No      Review of Systems  Constitutional: Negative.   HENT: Negative.   Eyes:       Blind in right eye  Respiratory: Negative.   Cardiovascular: Negative.   Gastrointestinal: Negative.   Musculoskeletal: Negative.        LeftFoot pain, radiating to left calk  Skin: Negative.   Neurological: Negative.   Hematological: Negative.   Psychiatric/Behavioral: Negative.     Allergies  Review of patient's allergies indicates no known allergies.  Home Medications   Current Outpatient Rx  Name Route Sig Dispense Refill  . CLONIDINE HCL 0.1 MG PO TABS Oral Take 0.2 mg by mouth daily.     . IBUPROFEN 800 MG PO TABS Oral Take 800 mg by mouth as needed. pain    . QUETIAPINE FUMARATE 300 MG PO TABS Oral Take 300 mg by mouth at bedtime.     Marland Kitchen QUETIAPINE FUMARATE 150 MG PO TB24 Oral Take 300 mg by mouth at bedtime.     . TRAMADOL HCL 50 MG PO TABS Oral Take 50 mg by mouth every 4 (four)  hours as needed. pain      BP 157/107  Pulse 79  Temp(Src) 98 F (36.7 C) (Oral)  Resp 20  Ht 6' (1.829 m)  Wt 225 lb (102.059 kg)  BMI 30.52 kg/m2  SpO2 100%  Physical Exam  Nursing note and vitals reviewed. Constitutional: He appears well-developed and well-nourished.  HENT:  Head: Normocephalic and atraumatic.  Eyes:       Extraocular muscles intact right cornea is opacified  Neck: Neck supple. No tracheal deviation present. No thyromegaly present.  Cardiovascular: Normal rate and regular rhythm.   No murmur heard. Pulmonary/Chest: Effort normal and breath sounds normal.  Abdominal: Soft. Bowel sounds are normal. He exhibits no distension. There is no tenderness.  Musculoskeletal: He exhibits no edema and no tenderness.       Left lower extremity. No redness warmth or swelling of the calf or foot or thigh the foot has a surgical scar over the dorsal aspect of great toe. the foot is diffusely tender dorsally DP pulse 2+ good capillary refill  Neurological: He is alert. Coordination normal.  Skin: Skin is warm and dry. No rash noted.  Psychiatric: He has a normal mood and affect.    ED Course  Procedures (including critical care  time) Pain is improved at 11:15 AM patient alert Glasgow Coma Score 15 Labs Reviewed - No data to display No results found.   No diagnosis found.    MDM  Clinically there is no signs of DVT, gout or infection Plan crutches prescription hydrocodone-A. Pap blood pressure recheck 1 week. Patient's instructed to follow up with Dr.Shearer (foot surgeon who performed bunionectomy    Diagnosis #1 pain in left foot #2 hypertension   [name], MD 02/18/11 1121  [name], MD 02/18/11 1125

## 2011-02-18 NOTE — ED Notes (Signed)
Patient states he had had left foot pain for one week.  States last night the pain intensified.  Pain started in the left foot and now is having left calf.  Patient states he has a hx of DVT and surgery in the left foot.  No known injury.

## 2011-04-17 ENCOUNTER — Emergency Department (INDEPENDENT_AMBULATORY_CARE_PROVIDER_SITE_OTHER): Payer: Self-pay

## 2011-04-17 ENCOUNTER — Inpatient Hospital Stay (HOSPITAL_BASED_OUTPATIENT_CLINIC_OR_DEPARTMENT_OTHER)
Admission: EM | Admit: 2011-04-17 | Discharge: 2011-04-23 | DRG: 394 | Disposition: A | Discharge status: Home / Self Care | Payer: Self-pay | Attending: Internal Medicine | Admitting: Internal Medicine

## 2011-04-17 ENCOUNTER — Encounter (HOSPITAL_BASED_OUTPATIENT_CLINIC_OR_DEPARTMENT_OTHER): Payer: Self-pay | Admitting: *Deleted

## 2011-04-17 DIAGNOSIS — Q619 Cystic kidney disease, unspecified: Secondary | ICD-10-CM | POA: Diagnosis present

## 2011-04-17 DIAGNOSIS — K5730 Diverticulosis of large intestine without perforation or abscess without bleeding: Secondary | ICD-10-CM | POA: Diagnosis present

## 2011-04-17 DIAGNOSIS — N134 Hydroureter: Secondary | ICD-10-CM

## 2011-04-17 DIAGNOSIS — R197 Diarrhea, unspecified: Secondary | ICD-10-CM

## 2011-04-17 DIAGNOSIS — I10 Essential (primary) hypertension: Secondary | ICD-10-CM

## 2011-04-17 DIAGNOSIS — N179 Acute kidney failure, unspecified: Secondary | ICD-10-CM

## 2011-04-17 DIAGNOSIS — K639 Disease of intestine, unspecified: Principal | ICD-10-CM | POA: Diagnosis present

## 2011-04-17 DIAGNOSIS — M109 Gout, unspecified: Secondary | ICD-10-CM | POA: Diagnosis present

## 2011-04-17 DIAGNOSIS — R319 Hematuria, unspecified: Secondary | ICD-10-CM

## 2011-04-17 DIAGNOSIS — R1110 Vomiting, unspecified: Secondary | ICD-10-CM

## 2011-04-17 DIAGNOSIS — H5440 Blindness, one eye, unspecified eye: Secondary | ICD-10-CM | POA: Diagnosis present

## 2011-04-17 DIAGNOSIS — R112 Nausea with vomiting, unspecified: Secondary | ICD-10-CM | POA: Diagnosis not present

## 2011-04-17 DIAGNOSIS — F329 Major depressive disorder, single episode, unspecified: Secondary | ICD-10-CM | POA: Diagnosis present

## 2011-04-17 DIAGNOSIS — R1084 Generalized abdominal pain: Secondary | ICD-10-CM | POA: Diagnosis present

## 2011-04-17 DIAGNOSIS — F3289 Other specified depressive episodes: Secondary | ICD-10-CM | POA: Diagnosis present

## 2011-04-17 DIAGNOSIS — E876 Hypokalemia: Secondary | ICD-10-CM | POA: Diagnosis not present

## 2011-04-17 DIAGNOSIS — R300 Dysuria: Secondary | ICD-10-CM

## 2011-04-17 DIAGNOSIS — R109 Unspecified abdominal pain: Secondary | ICD-10-CM

## 2011-04-17 DIAGNOSIS — G43909 Migraine, unspecified, not intractable, without status migrainosus: Secondary | ICD-10-CM | POA: Diagnosis present

## 2011-04-17 DIAGNOSIS — Z86718 Personal history of other venous thrombosis and embolism: Secondary | ICD-10-CM

## 2011-04-17 LAB — URINALYSIS, ROUTINE W REFLEX MICROSCOPIC
Glucose, UA: NEGATIVE mg/dL
Ketones, ur: NEGATIVE mg/dL
Nitrite: NEGATIVE
Protein, ur: NEGATIVE mg/dL
pH: 5.5 (ref 5.0–8.0)

## 2011-04-17 LAB — COMPREHENSIVE METABOLIC PANEL
ALT: 27 U/L (ref 0–53)
AST: 15 U/L (ref 0–37)
Albumin: 3.6 g/dL (ref 3.5–5.2)
Alkaline Phosphatase: 91 U/L (ref 39–117)
CO2: 28 mEq/L (ref 19–32)
Chloride: 104 mEq/L (ref 96–112)
GFR calc non Af Amer: 52 mL/min — ABNORMAL LOW (ref >90)
Potassium: 3.9 mEq/L (ref 3.5–5.1)
Sodium: 140 mEq/L (ref 135–145)
Total Bilirubin: 0.5 mg/dL (ref 0.3–1.2)

## 2011-04-17 LAB — CBC
HCT: 40.2 % (ref 39.0–52.0)
MCHC: 32.6 g/dL (ref 30.0–36.0)
RDW: 16.5 % — ABNORMAL HIGH (ref 11.5–15.5)
WBC: 8.3 10*3/uL (ref 4.0–10.5)

## 2011-04-17 LAB — DIFFERENTIAL
Basophils Absolute: 0 10*3/uL (ref 0.0–0.1)
Basophils Relative: 0 % (ref 0–1)
Lymphocytes Relative: 27 % (ref 12–46)
Monocytes Absolute: 0.6 10*3/uL (ref 0.1–1.0)
Neutro Abs: 5.2 10*3/uL (ref 1.7–7.7)
Neutrophils Relative %: 63 % (ref 43–77)

## 2011-04-17 LAB — URINE MICROSCOPIC-ADD ON

## 2011-04-17 MED ORDER — MORPHINE SULFATE 4 MG/ML IJ SOLN
6.0000 mg | Freq: Once | INTRAMUSCULAR | Status: AC
  Administered 2011-04-17: 4 mg via INTRAVENOUS
  Filled 2011-04-17: qty 1

## 2011-04-17 MED ORDER — METRONIDAZOLE IN NACL 5-0.79 MG/ML-% IV SOLN
500.0000 mg | Freq: Once | INTRAVENOUS | Status: AC
  Administered 2011-04-17: 500 mg via INTRAVENOUS
  Filled 2011-04-17: qty 100

## 2011-04-17 MED ORDER — IOHEXOL 300 MG/ML  SOLN
80.0000 mL | Freq: Once | INTRAMUSCULAR | Status: AC | PRN
  Administered 2011-04-17: 80 mL via INTRAVENOUS

## 2011-04-17 MED ORDER — SODIUM CHLORIDE 0.9 % IV SOLN: INTRAVENOUS | Status: DC

## 2011-04-17 MED ORDER — MORPHINE SULFATE 2 MG/ML IJ SOLN
INTRAMUSCULAR | Status: AC
  Administered 2011-04-17: 2 mg via INTRAVENOUS
  Filled 2011-04-17: qty 1

## 2011-04-17 MED ORDER — IOHEXOL 300 MG/ML  SOLN
20.0000 mL | INTRAMUSCULAR | Status: AC
  Administered 2011-04-17: 20 mL via ORAL

## 2011-04-17 MED ORDER — ONDANSETRON HCL 4 MG/2ML IJ SOLN
4.0000 mg | Freq: Once | INTRAMUSCULAR | Status: AC
  Administered 2011-04-17: 4 mg via INTRAVENOUS
  Filled 2011-04-17: qty 2

## 2011-04-17 MED ORDER — SODIUM CHLORIDE 0.9 % IV SOLN
INTRAVENOUS | Status: DC
  Administered 2011-04-17: 19:00:00 via INTRAVENOUS

## 2011-04-17 MED ORDER — CIPROFLOXACIN IN D5W 400 MG/200ML IV SOLN
400.0000 mg | Freq: Once | INTRAVENOUS | Status: AC
  Administered 2011-04-17: 400 mg via INTRAVENOUS
  Filled 2011-04-17: qty 200

## 2011-04-17 NOTE — ED Notes (Signed)
Report to Jomarie Longs, RN on [phone]

## 2011-04-17 NOTE — ED Provider Notes (Addendum)
History   This chart was scribed for [name], MD by Sofie Rower. The patient was seen in room MH11/MH11 and the patient's care was started at 6:31PM .    CSN: [id number]  Arrival date & time 04/17/11  1746   First MD Initiated Contact with Patient 04/17/11 1818      Chief Complaint  Patient presents with  . Abdominal Pain    (Consider location/radiation/quality/duration/timing/severity/associated sxs/prior treatment) HPI Arthur Raymond is a 50 y.o. male who presents to the Emergency Department complaining of moderate, constant abdominal pain onset two days ago with associated symptoms of vomiting (4X today), hematuria, nausea and sweats. Modifying factors include lying on stomach which intensifies the pain and has been treating the pain with Toradol which moderately relieves the pain.. Pt has a history of diverticulitis, kidney stones in 2010 at [facility]. Pt denies smoking and drinking.  Past Medical History  Diagnosis Date  . Kidney stones   . High blood pressure   . Gout   . DVT (deep venous thrombosis)   . Migraine   . Depression     Past Surgical History  Procedure Date  . Stomach surgery     History reviewed. No pertinent family history.  History  Substance Use Topics  . Smoking status: Never Smoker   . Smokeless tobacco: Not on file  . Alcohol Use: No      Review of Systems  10 Systems reviewed and are negative for acute change except as noted in the HPI.   Allergies  Review of patient's allergies indicates no known allergies.  Home Medications   Current Outpatient Rx  Name Route Sig Dispense Refill  . CLONIDINE HCL 0.1 MG PO TABS Oral Take 0.2 mg by mouth daily.     . IBUPROFEN 800 MG PO TABS Oral Take 800 mg by mouth as needed. pain    . QUETIAPINE FUMARATE 150 MG PO TB24 Oral Take 300 mg by mouth at bedtime.     . TRAMADOL HCL 50 MG PO TABS Oral Take 50 mg by mouth every 4 (four) hours as needed. pain      BP 148/89  Pulse 82   Temp(Src) 98.3 F (36.8 C) (Oral)  Resp 20  Ht 6' (1.829 m)  Wt 215 lb (97.523 kg)  BMI 29.16 kg/m2  SpO2 99%  Physical Exam  Nursing note and vitals reviewed. Constitutional: He is oriented to person, place, and time. He appears well-developed and well-nourished. No distress.  HENT:  Head: Normocephalic and atraumatic.  Eyes: EOM are normal. Pupils are equal, round, and reactive to light.  Neck: Neck supple. No tracheal deviation present.  Cardiovascular: Normal rate, regular rhythm and normal heart sounds.   Pulmonary/Chest: Effort normal and breath sounds normal. No respiratory distress.  Abdominal: Soft. Bowel sounds are normal. He exhibits no distension. There is tenderness. There is no guarding.       Non distended.  Genitourinary: Penis normal. No penile tenderness.  Musculoskeletal: Normal range of motion. He exhibits no edema and no tenderness.  Neurological: He is alert and oriented to person, place, and time. No sensory deficit.  Skin: Skin is warm and dry.  Psychiatric: He has a normal mood and affect. His behavior is normal.   correction I exam right cornea opacified  ED Course  Procedures (including critical care time)  DIAGNOSTIC STUDIES: Oxygen Saturation is 99% on room air, normal by my interpretation.    COORDINATION OF CARE:    Results for  orders placed during the hospital encounter of 04/17/11  URINALYSIS, ROUTINE W REFLEX MICROSCOPIC      Component Value Range   Color, Urine YELLOW  YELLOW    APPearance CLEAR  CLEAR    Specific Gravity, Urine 1.021  1.005 - 1.030    pH 5.5  5.0 - 8.0    Glucose, UA NEGATIVE  NEGATIVE (mg/dL)   Hgb urine dipstick SMALL (*) NEGATIVE    Bilirubin Urine NEGATIVE  NEGATIVE    Ketones, ur NEGATIVE  NEGATIVE (mg/dL)   Protein, ur NEGATIVE  NEGATIVE (mg/dL)   Urobilinogen, UA 0.2  0.0 - 1.0 (mg/dL)   Nitrite NEGATIVE  NEGATIVE    Leukocytes, UA TRACE (*) NEGATIVE   CBC      Component Value Range   WBC 8.3  4.0 - 10.5  (K/uL)   RBC 5.25  4.22 - 5.81 (MIL/uL)   Hemoglobin 13.1  13.0 - 17.0 (g/dL)   HCT 40.9  81.1 - 91.4 (%)   MCV 76.6 (*) 78.0 - 100.0 (fL)   MCH 25.0 (*) 26.0 - 34.0 (pg)   MCHC 32.6  30.0 - 36.0 (g/dL)   RDW 78.2 (*) 95.6 - 15.5 (%)   Platelets 265  150 - 400 (K/uL)  DIFFERENTIAL      Component Value Range   Neutrophils Relative 63  43 - 77 (%)   Neutro Abs 5.2  1.7 - 7.7 (K/uL)   Lymphocytes Relative 27  12 - 46 (%)   Lymphs Abs 2.2  0.7 - 4.0 (K/uL)   Monocytes Relative 8  3 - 12 (%)   Monocytes Absolute 0.6  0.1 - 1.0 (K/uL)   Eosinophils Relative 2  0 - 5 (%)   Eosinophils Absolute 0.2  0.0 - 0.7 (K/uL)   Basophils Relative 0  0 - 1 (%)   Basophils Absolute 0.0  0.0 - 0.1 (K/uL)  COMPREHENSIVE METABOLIC PANEL      Component Value Range   Sodium 140  135 - 145 (mEq/L)   Potassium 3.9  3.5 - 5.1 (mEq/L)   Chloride 104  96 - 112 (mEq/L)   CO2 28  19 - 32 (mEq/L)   Glucose, Bld 110 (*) 70 - 99 (mg/dL)   BUN 19  6 - 23 (mg/dL)   Creatinine, Ser 2.13 (*) 0.50 - 1.35 (mg/dL)   Calcium 9.0  8.4 - 08.6 (mg/dL)   Total Protein 7.3  6.0 - 8.3 (g/dL)   Albumin 3.6  3.5 - 5.2 (g/dL)   AST 15  0 - 37 (U/L)   ALT 27  0 - 53 (U/L)   Alkaline Phosphatase 91  39 - 117 (U/L)   Total Bilirubin 0.5  0.3 - 1.2 (mg/dL)   GFR calc non Af Amer 52 (*) >90 (mL/min)   GFR calc Af Amer 60 (*) >90 (mL/min)  LIPASE, BLOOD      Component Value Range   Lipase 28  11 - 59 (U/L)  URINE MICROSCOPIC-ADD ON      Component Value Range   Squamous Epithelial / LPF FEW (*) RARE    WBC, UA 3-6  <3 (WBC/hpf)   RBC / HPF 0-2  <3 (RBC/hpf)   Bacteria, UA FEW (*) RARE    Urine-Other MUCOUS PRESENT     Ct Abdomen Pelvis W Contrast  04/17/2011  *RADIOLOGY REPORT*  Clinical Data: Worsening abdominal pain and vomiting.  Nausea diaphoresis.  Hematuria.  Previous history of urolithiasis and diverticulitis.  CT ABDOMEN AND PELVIS  WITH CONTRAST  Technique:  Multidetector CT imaging of the abdomen and pelvis was  performed following the standard protocol during bolus administration of intravenous contrast.  Contrast:  100 ml Omnipaque-300 and oral contrast  Comparison: 11/10/2010 and 02/27/09  Findings: Postop changes seen from previous right colectomy. Moderate wall thickening and mild adjacent mesenteric inflammatory changes are seen involving the distal small bowel just proximal to the ileocolostomy anastomosis.  This is consistent with enteritis. No evidence of pneumatosis, abscess or free fluid.  No evidence of bowel obstruction.  The liver, spleen, pancreas, gallbladder, and both adrenal glands are normal in appearance.  No lymphadenopathy identified.  Bilateral renal parenchymal scarring noted as well as tiny nonobstructing calculus in the upper pole of the right kidney.  No evidence of hydronephrosis.  A 2 cm lesion is seen in the anterior mid pole of the left kidney which has increased in size since previous study.  This could represent a hemorrhagic cyst or solid renal mass.  IMPRESSION:  1.  Distal small bowel wall thickening in the right abdomen, just proximal to the ileocolonic anastomosis.  This is consistent with enteritis, and differential diagnosis includes Crohn's disease, infectious etiologies, or less likely ischemia. 2.  Increased size of 2 cm indeterminate lesion in the anterior mid pole left kidney.  This could represent a hemorrhagic cyst or solid renal mass. Non-emergent abdomen MRI without with contrast is recommended for further characterization when the patient is able to comply with breath holding instructions.  Original Report Authenticated By: Danae Orleans, M.D.        MDM  A not well controlled after morphine 6 g IV additional pain medicine ordered. Plan admit for intravenous antibiotics and pain medication. Spoke with Dr. Toniann Fail excess patient in transfer to [facility] [place]  6:30PM- EDP at bedside discusses treatment plan. 9:12PM- Recheck. EDP at bedside discusses  treatment plan. Diagnosis abdominal pain  I personally performed the services described in this documentation, which was scribed in my presence. The recorded information has been reviewed and considered.      [name], MD 04/17/11 2150  [name], MD 04/17/11 4132  [name], MD 04/17/11 2153

## 2011-04-17 NOTE — ED Notes (Signed)
Pt c/o lower abd pain/n/vd since Friday. ? Hx of diverticulitis.

## 2011-04-18 ENCOUNTER — Encounter (HOSPITAL_COMMUNITY): Payer: Self-pay | Admitting: *Deleted

## 2011-04-18 ENCOUNTER — Inpatient Hospital Stay (HOSPITAL_COMMUNITY): Payer: Self-pay

## 2011-04-18 DIAGNOSIS — N179 Acute kidney failure, unspecified: Secondary | ICD-10-CM | POA: Diagnosis present

## 2011-04-18 DIAGNOSIS — R109 Unspecified abdominal pain: Secondary | ICD-10-CM

## 2011-04-18 DIAGNOSIS — I10 Essential (primary) hypertension: Secondary | ICD-10-CM | POA: Diagnosis present

## 2011-04-18 DIAGNOSIS — R300 Dysuria: Secondary | ICD-10-CM | POA: Diagnosis present

## 2011-04-18 LAB — COMPREHENSIVE METABOLIC PANEL
ALT: 23 U/L (ref 0–53)
AST: 15 U/L (ref 0–37)
Alkaline Phosphatase: 74 U/L (ref 39–117)
GFR calc Af Amer: 71 mL/min — ABNORMAL LOW (ref >90)
Glucose, Bld: 86 mg/dL (ref 70–99)
Potassium: 3.6 mEq/L (ref 3.5–5.1)
Sodium: 140 mEq/L (ref 135–145)
Total Protein: 6.2 g/dL (ref 6.0–8.3)

## 2011-04-18 LAB — CBC
HCT: 37 % — ABNORMAL LOW (ref 39.0–52.0)
Hemoglobin: 11.6 g/dL — ABNORMAL LOW (ref 13.0–17.0)
MCH: 24.6 pg — ABNORMAL LOW (ref 26.0–34.0)
MCHC: 31.4 g/dL (ref 30.0–36.0)
MCV: 78.6 fL (ref 78.0–100.0)
RDW: 16.5 % — ABNORMAL HIGH (ref 11.5–15.5)

## 2011-04-18 LAB — LACTIC ACID, PLASMA: Lactic Acid, Venous: 0.6 mmol/L (ref 0.5–2.2)

## 2011-04-18 MED ORDER — HYDRALAZINE HCL 20 MG/ML IJ SOLN
10.0000 mg | INTRAMUSCULAR | Status: DC | PRN
  Administered 2011-04-19 – 2011-04-20 (×2): 10 mg via INTRAVENOUS
  Filled 2011-04-18: qty 0.5

## 2011-04-18 MED ORDER — ACETAMINOPHEN 650 MG RE SUPP: 650.0000 mg | Freq: Four times a day (QID) | RECTAL | Status: DC | PRN

## 2011-04-18 MED ORDER — CLONIDINE HCL 0.1 MG/24HR TD PTWK
0.1000 mg | MEDICATED_PATCH | TRANSDERMAL | Status: DC
  Administered 2011-04-18: 0.1 mg via TRANSDERMAL
  Filled 2011-04-18: qty 1

## 2011-04-18 MED ORDER — CIPROFLOXACIN IN D5W 400 MG/200ML IV SOLN
400.0000 mg | Freq: Two times a day (BID) | INTRAVENOUS | Status: DC
  Administered 2011-04-18 – 2011-04-21 (×7): 400 mg via INTRAVENOUS
  Filled 2011-04-18 (×9): qty 200

## 2011-04-18 MED ORDER — HYDROMORPHONE HCL PF 1 MG/ML IJ SOLN
INTRAMUSCULAR | Status: AC
  Filled 2011-04-18: qty 1

## 2011-04-18 MED ORDER — SODIUM CHLORIDE 0.9 % IV SOLN
INTRAVENOUS | Status: DC
  Administered 2011-04-18 – 2011-04-22 (×8): via INTRAVENOUS

## 2011-04-18 MED ORDER — LORAZEPAM 2 MG/ML IJ SOLN: 0.5000 mg | Freq: Four times a day (QID) | INTRAMUSCULAR | Status: DC | PRN

## 2011-04-18 MED ORDER — ACETAMINOPHEN 325 MG PO TABS
650.0000 mg | ORAL_TABLET | Freq: Four times a day (QID) | ORAL | Status: DC | PRN
  Administered 2011-04-19 (×2): 650 mg via ORAL
  Filled 2011-04-18 (×2): qty 2

## 2011-04-18 MED ORDER — METRONIDAZOLE IN NACL 5-0.79 MG/ML-% IV SOLN
500.0000 mg | Freq: Three times a day (TID) | INTRAVENOUS | Status: DC
  Administered 2011-04-18 – 2011-04-21 (×10): 500 mg via INTRAVENOUS
  Filled 2011-04-18 (×12): qty 100

## 2011-04-18 MED ORDER — ONDANSETRON HCL 4 MG PO TABS
4.0000 mg | ORAL_TABLET | Freq: Four times a day (QID) | ORAL | Status: DC | PRN
  Administered 2011-04-23: 4 mg via ORAL
  Filled 2011-04-18: qty 1

## 2011-04-18 MED ORDER — GADOBENATE DIMEGLUMINE 529 MG/ML IV SOLN
20.0000 mL | Freq: Once | INTRAVENOUS | Status: AC | PRN
  Administered 2011-04-18: 20 mL via INTRAVENOUS

## 2011-04-18 MED ORDER — HYDROMORPHONE HCL PF 1 MG/ML IJ SOLN
1.0000 mg | Freq: Once | INTRAMUSCULAR | Status: AC
  Administered 2011-04-18: 1 mg via INTRAVENOUS

## 2011-04-18 MED ORDER — HYDROMORPHONE HCL PF 1 MG/ML IJ SOLN
0.5000 mg | INTRAMUSCULAR | Status: DC | PRN
  Administered 2011-04-18 – 2011-04-23 (×23): 0.5 mg via INTRAVENOUS
  Filled 2011-04-18 (×23): qty 1

## 2011-04-18 MED ORDER — ONDANSETRON HCL 4 MG/2ML IJ SOLN
4.0000 mg | Freq: Four times a day (QID) | INTRAMUSCULAR | Status: DC | PRN
  Administered 2011-04-20 – 2011-04-22 (×7): 4 mg via INTRAVENOUS
  Filled 2011-04-18 (×7): qty 2

## 2011-04-18 NOTE — Progress Notes (Signed)
50yo male to begin IV ABX for enteritis.  Renal fxn slightly compromised but CrCl ~56ml/min.  Will begin Cipro 400mg  IV Q12H and monitor.  Vernard Gambles, PharmD, BCPS 04/18/2011 2:47 AM

## 2011-04-18 NOTE — Consult Note (Addendum)
[facility] Consult Note  Referring Provider: No ref. provider found Primary Care Physician:  No primary provider on file. Primary Gastroenterologist:  Dr.  Antony Contras Complaint: Abdominal pain nausea and vomiting HPI: Arthur Raymond is an 50 y.o. black male who was admitted with a 2-3 day history of nausea vomiting and diffuse abdominal pain. He presented to [place] [facility] room yesterday and was subsequently admitted after CT scan showed thickening of the terminal ileum near an ileocolonic anastomosis. The patient states he had surgery 2 years ago for a colonoscopically unresectable polyp that did not proved to be malignant. Since this time he states that he has had 2 or 3 episodes similar to his current presentation but has never had worked up and has not had a colonoscopy since his surgery. He denies any significant diarrhea and has not had any fever or leukocytosis.  Past Medical History  Diagnosis Date  . Gout   . DVT (deep venous thrombosis)   . Kidney stones   . High blood pressure   . Migraine   . Depression     Past Surgical History  Procedure Date  . Stomach surgery     Medications Prior to Admission  Medication Dose Route Frequency Provider Last Rate Last Dose  . 0.9 %  sodium chloride infusion   Intravenous Continuous [name] 125 mL/hr at 04/18/11 0235    . acetaminophen (TYLENOL) tablet 650 mg  650 mg Oral Q6H PRN [name]       Or  . acetaminophen (TYLENOL) suppository 650 mg  650 mg Rectal Q6H PRN [name]      . ciprofloxacin (CIPRO) IVPB 400 mg  400 mg Intravenous Once [name], MD   400 mg at 04/17/11 2127  . ciprofloxacin (CIPRO) IVPB 400 mg  400 mg Intravenous Q12H Colleen Can, PHARMD   400 mg at 04/18/11 1610  . cloNIDine (CATAPRES - Dosed in mg/24 hr) patch 0.1 mg  0.1 mg Transdermal Weekly Arshad N. Kakrakandy   0.1 mg at 04/18/11 0908  . hydrALAZINE (APRESOLINE) injection 10 mg  10 mg  Intravenous Q4H PRN [name]      . HYDROmorphone (DILAUDID) injection 0.5 mg  0.5 mg Intravenous Q4H PRN [name]   0.5 mg at 04/18/11 0958  . HYDROmorphone (DILAUDID) injection 1 mg  1 mg Intravenous Once [name], MD   1 mg at 04/18/11 0014  . iohexol (OMNIPAQUE) 300 MG/ML solution 20 mL  20 mL Oral Q1 Hr x 2 Medication Radiologist   20 mL at 04/17/11 2026  . iohexol (OMNIPAQUE) 300 MG/ML solution 80 mL  80 mL Intravenous Once PRN Medication Radiologist   80 mL at 04/17/11 2026  . LORazepam (ATIVAN) injection 0.5 mg  0.5 mg Intravenous Q6H PRN [name]      . metroNIDAZOLE (FLAGYL) IVPB 500 mg  500 mg Intravenous Once [name], MD   500 mg at 04/17/11 2305  . metroNIDAZOLE (FLAGYL) IVPB 500 mg  500 mg Intravenous Q8H Arshad N. Kakrakandy   500 mg at 04/18/11 0511  . morphine 2 MG/ML injection        2 mg at 04/17/11 1845  . morphine 2 MG/ML injection        2 mg at 04/17/11 2126  . morphine 4 MG/ML injection 6 mg  6 mg Intravenous Once [name], MD   4 mg at 04/17/11 1845  . morphine 4 MG/ML injection  6 mg  6 mg Intravenous Once [name], MD   4 mg at 04/17/11 2126  . ondansetron (ZOFRAN) injection 4 mg  4 mg Intravenous Once [name], MD   4 mg at 04/17/11 1844  . ondansetron (ZOFRAN) tablet 4 mg  4 mg Oral Q6H PRN [name]       Or  . ondansetron (ZOFRAN) injection 4 mg  4 mg Intravenous Q6H PRN [name]      . DISCONTD: 0.9 %  sodium chloride infusion   Intravenous Continuous [name], MD 125 mL/hr at 04/17/11 1844    . DISCONTD: 0.9 %  sodium chloride infusion   Intravenous STAT [name], MD       Medications Prior to Admission  Medication Sig Dispense Refill  . cloNIDine (CATAPRES) 0.1 MG tablet Take 0.2 mg by mouth daily.       Marland Kitchen ibuprofen (ADVIL,MOTRIN) 800 MG tablet Take 800 mg by mouth as needed. pain      . QUEtiapine Fumarate (SEROQUEL XR) 150 MG 24 hr tablet Take 300 mg by mouth  at bedtime.       . traMADol (ULTRAM) 50 MG tablet Take 50 mg by mouth every 4 (four) hours as needed. pain        Allergies: No Known Allergies  Family History  Problem Relation Age of Onset  . Cancer Mother   . Pancreatitis Father   . Alcohol abuse Father   . Colon cancer Sister     Social History:  reports that he has never smoked. He does not have any smokeless tobacco history on file. He reports that he does not drink alcohol or use illicit drugs.  Negative except as listed above   Blood pressure 151/93, pulse 54, temperature 98 F (36.7 C), temperature source Oral, resp. rate 18, height 6' (1.829 m), weight 94.756 kg (208 lb 14.4 oz), SpO2 99.00%. Head: Normocephalic, without obvious abnormality, atraumatic Neck: no adenopathy, no carotid bruit, no JVD, supple, symmetrical, trachea midline and thyroid not enlarged, symmetric, no tenderness/mass/nodules Resp: clear to auscultation bilaterally Cardio: regular rate and rhythm, S1, S2 normal, no murmur, click, rub or gallop GI: Abdomen soft nondistended with normoactive bowel sounds no hepatosplenomegaly mass or guarding. There is moderate tenderness in both lower quadrants and suprapubic area. Extremities: extremities normal, atraumatic, no cyanosis or edema  Results for orders placed during the hospital encounter of 04/17/11 (from the past 48 hour(s))  URINALYSIS, ROUTINE W REFLEX MICROSCOPIC     Status: Abnormal   Collection Time   04/17/11  6:07 PM      Component Value Range Comment   Color, Urine YELLOW  YELLOW     APPearance CLEAR  CLEAR     Specific Gravity, Urine 1.021  1.005 - 1.030     pH 5.5  5.0 - 8.0     Glucose, UA NEGATIVE  NEGATIVE (mg/dL)    Hgb urine dipstick SMALL (*) NEGATIVE     Bilirubin Urine NEGATIVE  NEGATIVE     Ketones, ur NEGATIVE  NEGATIVE (mg/dL)    Protein, ur NEGATIVE  NEGATIVE (mg/dL)    Urobilinogen, UA 0.2  0.0 - 1.0 (mg/dL)    Nitrite NEGATIVE  NEGATIVE     Leukocytes, UA TRACE (*)  NEGATIVE    URINE MICROSCOPIC-ADD ON     Status: Abnormal   Collection Time   04/17/11  6:07 PM      Component Value Range Comment   Squamous Epithelial /  LPF FEW (*) RARE     WBC, UA 3-6  <3 (WBC/hpf)    RBC / HPF 0-2  <3 (RBC/hpf)    Bacteria, UA FEW (*) RARE     Urine-Other MUCOUS PRESENT     CBC     Status: Abnormal   Collection Time   04/17/11  6:36 PM      Component Value Range Comment   WBC 8.3  4.0 - 10.5 (K/uL)    RBC 5.25  4.22 - 5.81 (MIL/uL)    Hemoglobin 13.1  13.0 - 17.0 (g/dL)    HCT 14.7  82.9 - 56.2 (%)    MCV 76.6 (*) 78.0 - 100.0 (fL)    MCH 25.0 (*) 26.0 - 34.0 (pg)    MCHC 32.6  30.0 - 36.0 (g/dL)    RDW 13.0 (*) 86.5 - 15.5 (%)    Platelets 265  150 - 400 (K/uL)   DIFFERENTIAL     Status: Normal   Collection Time   04/17/11  6:36 PM      Component Value Range Comment   Neutrophils Relative 63  43 - 77 (%)    Neutro Abs 5.2  1.7 - 7.7 (K/uL)    Lymphocytes Relative 27  12 - 46 (%)    Lymphs Abs 2.2  0.7 - 4.0 (K/uL)    Monocytes Relative 8  3 - 12 (%)    Monocytes Absolute 0.6  0.1 - 1.0 (K/uL)    Eosinophils Relative 2  0 - 5 (%)    Eosinophils Absolute 0.2  0.0 - 0.7 (K/uL)    Basophils Relative 0  0 - 1 (%)    Basophils Absolute 0.0  0.0 - 0.1 (K/uL)   COMPREHENSIVE METABOLIC PANEL     Status: Abnormal   Collection Time   04/17/11  6:36 PM      Component Value Range Comment   Sodium 140  135 - 145 (mEq/L)    Potassium 3.9  3.5 - 5.1 (mEq/L)    Chloride 104  96 - 112 (mEq/L)    CO2 28  19 - 32 (mEq/L)    Glucose, Bld 110 (*) 70 - 99 (mg/dL)    BUN 19  6 - 23 (mg/dL)    Creatinine, Ser 7.84 (*) 0.50 - 1.35 (mg/dL)    Calcium 9.0  8.4 - 10.5 (mg/dL)    Total Protein 7.3  6.0 - 8.3 (g/dL)    Albumin 3.6  3.5 - 5.2 (g/dL)    AST 15  0 - 37 (U/L)    ALT 27  0 - 53 (U/L)    Alkaline Phosphatase 91  39 - 117 (U/L)    Total Bilirubin 0.5  0.3 - 1.2 (mg/dL)    GFR calc non Af Amer 52 (*) >90 (mL/min)    GFR calc Af Amer 60 (*) >90 (mL/min)     LIPASE, BLOOD     Status: Normal   Collection Time   04/17/11  6:36 PM      Component Value Range Comment   Lipase 28  11 - 59 (U/L)   COMPREHENSIVE METABOLIC PANEL     Status: Abnormal   Collection Time   04/18/11  5:25 AM      Component Value Range Comment   Sodium 140  135 - 145 (mEq/L)    Potassium 3.6  3.5 - 5.1 (mEq/L)    Chloride 107  96 - 112 (mEq/L)    CO2 27  19 - 32 (  mEq/L)    Glucose, Bld 86  70 - 99 (mg/dL)    BUN 15  6 - 23 (mg/dL)    Creatinine, Ser 8.11  0.50 - 1.35 (mg/dL)    Calcium 8.6  8.4 - 10.5 (mg/dL)    Total Protein 6.2  6.0 - 8.3 (g/dL)    Albumin 2.9 (*) 3.5 - 5.2 (g/dL)    AST 15  0 - 37 (U/L)    ALT 23  0 - 53 (U/L)    Alkaline Phosphatase 74  39 - 117 (U/L)    Total Bilirubin 0.7  0.3 - 1.2 (mg/dL)    GFR calc non Af Amer 62 (*) >90 (mL/min)    GFR calc Af Amer 71 (*) >90 (mL/min)   CBC     Status: Abnormal   Collection Time   04/18/11  5:25 AM      Component Value Range Comment   WBC 6.3  4.0 - 10.5 (K/uL)    RBC 4.71  4.22 - 5.81 (MIL/uL)    Hemoglobin 11.6 (*) 13.0 - 17.0 (g/dL)    HCT 91.4 (*) 78.2 - 52.0 (%)    MCV 78.6  78.0 - 100.0 (fL)    MCH 24.6 (*) 26.0 - 34.0 (pg)    MCHC 31.4  30.0 - 36.0 (g/dL)    RDW 95.6 (*) 21.3 - 15.5 (%)    Platelets 232  150 - 400 (K/uL)   LACTIC ACID, PLASMA     Status: Normal   Collection Time   04/18/11  5:25 AM      Component Value Range Comment   Lactic Acid, Venous 0.6  0.5 - 2.2 (mmol/L)    Ct Abdomen Pelvis W Contrast  04/17/2011  *RADIOLOGY REPORT*  Clinical Data: Worsening abdominal pain and vomiting.  Nausea diaphoresis.  Hematuria.  Previous history of urolithiasis and diverticulitis.  CT ABDOMEN AND PELVIS WITH CONTRAST  Technique:  Multidetector CT imaging of the abdomen and pelvis was performed following the standard protocol during bolus administration of intravenous contrast.  Contrast:  100 ml Omnipaque-300 and oral contrast  Comparison: 11/10/2010 and 02/27/09  Findings: Postop  changes seen from previous right colectomy. Moderate wall thickening and mild adjacent mesenteric inflammatory changes are seen involving the distal small bowel just proximal to the ileocolostomy anastomosis.  This is consistent with enteritis. No evidence of pneumatosis, abscess or free fluid.  No evidence of bowel obstruction.  The liver, spleen, pancreas, gallbladder, and both adrenal glands are normal in appearance.  No lymphadenopathy identified.  Bilateral renal parenchymal scarring noted as well as tiny nonobstructing calculus in the upper pole of the right kidney.  No evidence of hydronephrosis.  A 2 cm lesion is seen in the anterior mid pole of the left kidney which has increased in size since previous study.  This could represent a hemorrhagic cyst or solid renal mass.  IMPRESSION:  1.  Distal small bowel wall thickening in the right abdomen, just proximal to the ileocolonic anastomosis.  This is consistent with enteritis, and differential diagnosis includes Crohn's disease, infectious etiologies, or less likely ischemia. 2.  Increased size of 2 cm indeterminate lesion in the anterior mid pole left kidney.  This could represent a hemorrhagic cyst or solid renal mass. Non-emergent abdomen MRI without with contrast is recommended for further characterization when the patient is able to comply with breath holding instructions.  Original Report Authenticated By: Danae Orleans, M.D.    Assessment: Terminal ileitis by CT scan with  nausea vomiting abdominal pain, suggestive of either an acute infectious enteritis or if recurrent episodes, Crohn's disease more likely. Plan:  Continue Cipro and bowel rest. Await pending stool studies. Will likely need colonoscopy at some point either during this admission or after discharge. Deacon Gadbois C 04/18/2011, 10:10 AM

## 2011-04-18 NOTE — Progress Notes (Signed)
Subjective: Complaining of abdominal pain  Objective: Vital signs in last 24 hours: Filed Vitals:   04/17/11 2338 04/18/11 0117 04/18/11 0515 04/18/11 0908  BP: 146/88 145/86 146/96 151/93  Pulse: 61 57 54   Temp:  98.2 F (36.8 C) 98 F (36.7 C)   TempSrc:  Oral Oral   Resp: 18 18 18    Height:  6' (1.829 m)    Weight:  94.756 kg (208 lb 14.4 oz)    SpO2: 99% 96% 99%     Intake/Output Summary (Last 24 hours) at 04/18/11 1155 Last data filed at 04/18/11 1100  Gross per 24 hour  Intake    685 ml  Output    175 ml  Net    510 ml    Weight change:   General: Alert, awake, oriented x3, in no acute distress. HEENT: No bruits, no goiter. Heart: Regular rate and rhythm, without murmurs, rubs, gallops. Lungs: Clear to auscultation bilaterally. Abdomen: Diffusely tender to palpation minor rebound or guarding Extremities: No clubbing cyanosis or edema with positive pedal pulses. Neuro: Grossly intact, nonfocal.    Lab Results: Results for orders placed during the hospital encounter of 04/17/11 (from the past 24 hour(s))  URINALYSIS, ROUTINE W REFLEX MICROSCOPIC     Status: Abnormal   Collection Time   04/17/11  6:07 PM      Component Value Range   Color, Urine YELLOW  YELLOW    APPearance CLEAR  CLEAR    Specific Gravity, Urine 1.021  1.005 - 1.030    pH 5.5  5.0 - 8.0    Glucose, UA NEGATIVE  NEGATIVE (mg/dL)   Hgb urine dipstick SMALL (*) NEGATIVE    Bilirubin Urine NEGATIVE  NEGATIVE    Ketones, ur NEGATIVE  NEGATIVE (mg/dL)   Protein, ur NEGATIVE  NEGATIVE (mg/dL)   Urobilinogen, UA 0.2  0.0 - 1.0 (mg/dL)   Nitrite NEGATIVE  NEGATIVE    Leukocytes, UA TRACE (*) NEGATIVE   URINE MICROSCOPIC-ADD ON     Status: Abnormal   Collection Time   04/17/11  6:07 PM      Component Value Range   Squamous Epithelial / LPF FEW (*) RARE    WBC, UA 3-6  <3 (WBC/hpf)   RBC / HPF 0-2  <3 (RBC/hpf)   Bacteria, UA FEW (*) RARE    Urine-Other MUCOUS PRESENT    CBC     Status:  Abnormal   Collection Time   04/17/11  6:36 PM      Component Value Range   WBC 8.3  4.0 - 10.5 (K/uL)   RBC 5.25  4.22 - 5.81 (MIL/uL)   Hemoglobin 13.1  13.0 - 17.0 (g/dL)   HCT 04.5  40.9 - 81.1 (%)   MCV 76.6 (*) 78.0 - 100.0 (fL)   MCH 25.0 (*) 26.0 - 34.0 (pg)   MCHC 32.6  30.0 - 36.0 (g/dL)   RDW 91.4 (*) 78.2 - 15.5 (%)   Platelets 265  150 - 400 (K/uL)  DIFFERENTIAL     Status: Normal   Collection Time   04/17/11  6:36 PM      Component Value Range   Neutrophils Relative 63  43 - 77 (%)   Neutro Abs 5.2  1.7 - 7.7 (K/uL)   Lymphocytes Relative 27  12 - 46 (%)   Lymphs Abs 2.2  0.7 - 4.0 (K/uL)   Monocytes Relative 8  3 - 12 (%)   Monocytes Absolute 0.6  0.1 -  1.0 (K/uL)   Eosinophils Relative 2  0 - 5 (%)   Eosinophils Absolute 0.2  0.0 - 0.7 (K/uL)   Basophils Relative 0  0 - 1 (%)   Basophils Absolute 0.0  0.0 - 0.1 (K/uL)  COMPREHENSIVE METABOLIC PANEL     Status: Abnormal   Collection Time   04/17/11  6:36 PM      Component Value Range   Sodium 140  135 - 145 (mEq/L)   Potassium 3.9  3.5 - 5.1 (mEq/L)   Chloride 104  96 - 112 (mEq/L)   CO2 28  19 - 32 (mEq/L)   Glucose, Bld 110 (*) 70 - 99 (mg/dL)   BUN 19  6 - 23 (mg/dL)   Creatinine, Ser 1.61 (*) 0.50 - 1.35 (mg/dL)   Calcium 9.0  8.4 - 09.6 (mg/dL)   Total Protein 7.3  6.0 - 8.3 (g/dL)   Albumin 3.6  3.5 - 5.2 (g/dL)   AST 15  0 - 37 (U/L)   ALT 27  0 - 53 (U/L)   Alkaline Phosphatase 91  39 - 117 (U/L)   Total Bilirubin 0.5  0.3 - 1.2 (mg/dL)   GFR calc non Af Amer 52 (*) >90 (mL/min)   GFR calc Af Amer 60 (*) >90 (mL/min)  LIPASE, BLOOD     Status: Normal   Collection Time   04/17/11  6:36 PM      Component Value Range   Lipase 28  11 - 59 (U/L)  COMPREHENSIVE METABOLIC PANEL     Status: Abnormal   Collection Time   04/18/11  5:25 AM      Component Value Range   Sodium 140  135 - 145 (mEq/L)   Potassium 3.6  3.5 - 5.1 (mEq/L)   Chloride 107  96 - 112 (mEq/L)   CO2 27  19 - 32 (mEq/L)    Glucose, Bld 86  70 - 99 (mg/dL)   BUN 15  6 - 23 (mg/dL)   Creatinine, Ser 0.45  0.50 - 1.35 (mg/dL)   Calcium 8.6  8.4 - 40.9 (mg/dL)   Total Protein 6.2  6.0 - 8.3 (g/dL)   Albumin 2.9 (*) 3.5 - 5.2 (g/dL)   AST 15  0 - 37 (U/L)   ALT 23  0 - 53 (U/L)   Alkaline Phosphatase 74  39 - 117 (U/L)   Total Bilirubin 0.7  0.3 - 1.2 (mg/dL)   GFR calc non Af Amer 62 (*) >90 (mL/min)   GFR calc Af Amer 71 (*) >90 (mL/min)  CBC     Status: Abnormal   Collection Time   04/18/11  5:25 AM      Component Value Range   WBC 6.3  4.0 - 10.5 (K/uL)   RBC 4.71  4.22 - 5.81 (MIL/uL)   Hemoglobin 11.6 (*) 13.0 - 17.0 (g/dL)   HCT 81.1 (*) 91.4 - 52.0 (%)   MCV 78.6  78.0 - 100.0 (fL)   MCH 24.6 (*) 26.0 - 34.0 (pg)   MCHC 31.4  30.0 - 36.0 (g/dL)   RDW 78.2 (*) 95.6 - 15.5 (%)   Platelets 232  150 - 400 (K/uL)  LACTIC ACID, PLASMA     Status: Normal   Collection Time   04/18/11  5:25 AM      Component Value Range   Lactic Acid, Venous 0.6  0.5 - 2.2 (mmol/L)     Micro: No results found for this or any previous visit (from  the past 240 hour(s)).  Studies/Results: Ct Abdomen Pelvis W Contrast  04/17/2011  *RADIOLOGY REPORT*  Clinical Data: Worsening abdominal pain and vomiting.  Nausea diaphoresis.  Hematuria.  Previous history of urolithiasis and diverticulitis.  CT ABDOMEN AND PELVIS WITH CONTRAST  Technique:  Multidetector CT imaging of the abdomen and pelvis was performed following the standard protocol during bolus administration of intravenous contrast.  Contrast:  100 ml Omnipaque-300 and oral contrast  Comparison: 11/10/2010 and 02/27/09  Findings: Postop changes seen from previous right colectomy. Moderate wall thickening and mild adjacent mesenteric inflammatory changes are seen involving the distal small bowel just proximal to the ileocolostomy anastomosis.  This is consistent with enteritis. No evidence of pneumatosis, abscess or free fluid.  No evidence of bowel obstruction.  The  liver, spleen, pancreas, gallbladder, and both adrenal glands are normal in appearance.  No lymphadenopathy identified.  Bilateral renal parenchymal scarring noted as well as tiny nonobstructing calculus in the upper pole of the right kidney.  No evidence of hydronephrosis.  A 2 cm lesion is seen in the anterior mid pole of the left kidney which has increased in size since previous study.  This could represent a hemorrhagic cyst or solid renal mass.  IMPRESSION:  1.  Distal small bowel wall thickening in the right abdomen, just proximal to the ileocolonic anastomosis.  This is consistent with enteritis, and differential diagnosis includes Crohn's disease, infectious etiologies, or less likely ischemia. 2.  Increased size of 2 cm indeterminate lesion in the anterior mid pole left kidney.  This could represent a hemorrhagic cyst or solid renal mass. Non-emergent abdomen MRI without with contrast is recommended for further characterization when the patient is able to comply with breath holding instructions.  Original Report Authenticated By: Danae Orleans, M.D.    Medications:     . ciprofloxacin  400 mg Intravenous Once  . ciprofloxacin  400 mg Intravenous Q12H  . cloNIDine  0.1 mg Transdermal Weekly  .  HYDROmorphone (DILAUDID) injection  1 mg Intravenous Once  . iohexol  20 mL Oral Q1 Hr x 2  . metronidazole  500 mg Intravenous Once  . metronidazole  500 mg Intravenous Q8H  . morphine      . morphine      .  morphine injection  6 mg Intravenous Once  .  morphine injection  6 mg Intravenous Once  . ondansetron  4 mg Intravenous Once  . DISCONTD: sodium chloride   Intravenous STAT     Assessment: Principal Problem:  *Abdominal pain Active Problems:  HTN (hypertension)  Dysuria  ARF (acute renal failure)   Plan: GI consultation has obtained, continue IV antibiotics My of the abdomen to evaluate the renal mass  LOS: 1 day   [facility] 04/18/2011, 11:55 AM

## 2011-04-18 NOTE — Progress Notes (Addendum)
Patient seen examined. Continues to have abdominal pain. Denies any bloody stools or ongoing diarrhea today. Continue IV antibiotics. [facility] has been consulted. We'll also do an MRI of the abdomen to further evaluate his renal mass.

## 2011-04-18 NOTE — Progress Notes (Addendum)
Arthur Raymond [id number] Code Status: Full Admission Data: 04/18/2011  Attending Provider:  [facility] PCP:No primary provider on file. Consults/ Treatment Team: Gastroenterology   Issaac Raymond is a 50 y.o. male patient admitted from [facility]. Awake, alert - oriented  X 3 - no acute distress noted.  VSS - Blood pressure 146/96, pulse 54, temperature 98 F (36.7 C), temperature source Oral, resp. rate 18, height 6' (1.829 m), weight 94.756 kg (208 lb 14.4 oz), SpO2 99.00%. Complaints of pain in right lower quadrant. IV Fluids:  IV in place, occlusive dsg intact without redness, IV cath right A/C with NS at 125cc/hour  Allergies:  No Known Allergies   Past Medical History  Diagnosis Date  . Gout   . DVT (deep venous thrombosis)   . Kidney stones   . High blood pressure   . Migraine   . Depression    Medications Prior to Admission  Medication Dose Route Frequency Provider Last Rate Last Dose  . 0.9 %  sodium chloride infusion   Intravenous Continuous [name] 125 mL/hr at 04/18/11 0235    . acetaminophen (TYLENOL) tablet 650 mg  650 mg Oral Q6H PRN [name]       Or  . acetaminophen (TYLENOL) suppository 650 mg  650 mg Rectal Q6H PRN [name]      . ciprofloxacin (CIPRO) IVPB 400 mg  400 mg Intravenous Once [name], MD   400 mg at 04/17/11 2127  . ciprofloxacin (CIPRO) IVPB 400 mg  400 mg Intravenous Q12H Colleen Can, PHARMD      . cloNIDine (CATAPRES - Dosed in mg/24 hr) patch 0.1 mg  0.1 mg Transdermal Weekly [name]      . hydrALAZINE (APRESOLINE) injection 10 mg  10 mg Intravenous Q4H PRN [name]      . HYDROmorphone (DILAUDID) injection 0.5 mg  0.5 mg Intravenous Q4H PRN [name]   0.5 mg at 04/18/11 0308  . HYDROmorphone (DILAUDID) injection 1 mg  1 mg Intravenous Once [name], MD   1 mg at 04/18/11 0014  . iohexol (OMNIPAQUE) 300 MG/ML solution 20 mL  20 mL Oral  Q1 Hr x 2 Medication Radiologist   20 mL at 04/17/11 2026  . iohexol (OMNIPAQUE) 300 MG/ML solution 80 mL  80 mL Intravenous Once PRN Medication Radiologist   80 mL at 04/17/11 2026  . LORazepam (ATIVAN) injection 0.5 mg  0.5 mg Intravenous Q6H PRN [name]      . metroNIDAZOLE (FLAGYL) IVPB 500 mg  500 mg Intravenous Once [name], MD   500 mg at 04/17/11 2305  . metroNIDAZOLE (FLAGYL) IVPB 500 mg  500 mg Intravenous Q8H Arshad N. Kakrakandy   500 mg at 04/18/11 0511  . morphine 2 MG/ML injection        2 mg at 04/17/11 1845  . morphine 2 MG/ML injection        2 mg at 04/17/11 2126  . morphine 4 MG/ML injection 6 mg  6 mg Intravenous Once [name], MD   4 mg at 04/17/11 1845  . morphine 4 MG/ML injection 6 mg  6 mg Intravenous Once [name], MD   4 mg at 04/17/11 2126  . ondansetron (ZOFRAN) injection 4 mg  4 mg Intravenous Once [name], MD   4 mg at 04/17/11 1844  . ondansetron (ZOFRAN) tablet 4 mg  4 mg Oral Q6H PRN Arshad N.  Kakrakandy       Or  . ondansetron (ZOFRAN) injection 4 mg  4 mg Intravenous Q6H PRN [name]      . DISCONTD: 0.9 %  sodium chloride infusion   Intravenous Continuous [name], MD 125 mL/hr at 04/17/11 1844    . DISCONTD: 0.9 %  sodium chloride infusion   Intravenous STAT [name], MD       Medications Prior to Admission  Medication Sig Dispense Refill  . cloNIDine (CATAPRES) 0.1 MG tablet Take 0.2 mg by mouth daily.       Marland Kitchen ibuprofen (ADVIL,MOTRIN) 800 MG tablet Take 800 mg by mouth as needed. pain      . QUEtiapine Fumarate (SEROQUEL XR) 150 MG 24 hr tablet Take 300 mg by mouth at bedtime.       . traMADol (ULTRAM) 50 MG tablet Take 50 mg by mouth every 4 (four) hours as needed. pain        Tobacco/alcohol: never a smoker or drinker.  Orientation to room, and floor completed with information packet given to patient/family.  Patient declined safety video at this time.  Admission INP armband ID verified  with patient/family, and in place.   SR up x 2, fall assessment complete, with patient and family able to verbalize understanding of risk associated with falls, and verbalized understanding to call nsg before up out of bed.  Call light within reach, patient able to voice, and demonstrate understanding.  Skin, clean-dry- intact without evidence of bruising, or skin tears.   No evidence of skin break down noted on exam. Able to ambulate independently. Placed on enteric precautions to rule out Cdiff per MD orders.     Will cont to eval and treat per MD orders.  Driggers, Rae Roam, RN 04/18/2011 5:49 AM

## 2011-04-18 NOTE — H&P (Addendum)
Arthur Raymond is an 50 y.o. male.   PCP - Goes to health department at [facility]. Chief Complaint: Abdominal pain. HPI: 50 year-old male with history of hypertension and gout present to the ER at [facility] but complains of no pain over the last 2 days. Patient states the pain started in the lower part of the abdomen became more diffuse and started to have multiple episodes of nausea and vomiting and eventually patient came to the ER. In the ER patient had a CAT scan of the abdomen and pelvis at this time shows features of enteritis at the ileocolonic anastomotic site. Patient states that 2 years ago he had  surgery done at [facility] for colonic mass. He states the mass was noncancerous. He has had about yesterday. Denies any chest pain or shortness of breath. Has had subjective feeling of chills but no fever.In addition patient is complaining of dysuria.  Past Medical History  Diagnosis Date  . Gout   . DVT (deep venous thrombosis)   . Kidney stones   . High blood pressure   . Migraine   . Depression     Past Surgical History  Procedure Date  . Stomach surgery     Family History  Problem Relation Age of Onset  . Cancer Mother   . Pancreatitis Father   . Alcohol abuse Father   . Colon cancer Sister    Social History:  reports that he has never smoked. He does not have any smokeless tobacco history on file. He reports that he does not drink alcohol or use illicit drugs.  Allergies: No Known Allergies  Medications Prior to Admission  Medication Dose Route Frequency Provider Last Rate Last Dose  . 0.9 %  sodium chloride infusion   Intravenous Continuous Arthur Raymond      . acetaminophen (TYLENOL) tablet 650 mg  650 mg Oral Q6H PRN Arthur Raymond       Or  . acetaminophen (TYLENOL) suppository 650 mg  650 mg Rectal Q6H PRN Arthur Raymond      . ciprofloxacin (CIPRO) IVPB 400 mg  400 mg Intravenous Once Arthur Sou, MD   400 mg at 04/17/11  2127  . cloNIDine (CATAPRES - Dosed in mg/24 hr) patch 0.1 mg  0.1 mg Transdermal Weekly Arthur Raymond      . hydrALAZINE (APRESOLINE) injection 10 mg  10 mg Intravenous Q4H PRN Arthur Raymond      . HYDROmorphone (DILAUDID) injection 1 mg  1 mg Intravenous Once Arthur Sou, MD   1 mg at 04/18/11 0014  . iohexol (OMNIPAQUE) 300 MG/ML solution 20 mL  20 mL Oral Q1 Hr x 2 Medication Radiologist   20 mL at 04/17/11 2026  . iohexol (OMNIPAQUE) 300 MG/ML solution 80 mL  80 mL Intravenous Once PRN Medication Radiologist   80 mL at 04/17/11 2026  . LORazepam (ATIVAN) injection 0.5 mg  0.5 mg Intravenous Q6H PRN Arthur Raymond      . metroNIDAZOLE (FLAGYL) IVPB 500 mg  500 mg Intravenous Once Arthur Sou, MD   500 mg at 04/17/11 2305  . metroNIDAZOLE (FLAGYL) IVPB 500 mg  500 mg Intravenous Q8H Arthur Raymond      . morphine 2 MG/ML injection        2 mg at 04/17/11 1845  . morphine 2 MG/ML injection        2 mg at 04/17/11 2126  . morphine 4 MG/ML  injection 6 mg  6 mg Intravenous Once Arthur Sou, MD   4 mg at 04/17/11 1845  . morphine 4 MG/ML injection 6 mg  6 mg Intravenous Once Arthur Sou, MD   4 mg at 04/17/11 2126  . ondansetron (ZOFRAN) injection 4 mg  4 mg Intravenous Once Arthur Sou, MD   4 mg at 04/17/11 1844  . ondansetron (ZOFRAN) tablet 4 mg  4 mg Oral Q6H PRN Arthur Raymond       Or  . ondansetron (ZOFRAN) injection 4 mg  4 mg Intravenous Q6H PRN Arthur Raymond      . DISCONTD: 0.9 %  sodium chloride infusion   Intravenous Continuous Arthur Sou, MD 125 mL/hr at 04/17/11 1844    . DISCONTD: 0.9 %  sodium chloride infusion   Intravenous STAT Arthur Sou, MD       Medications Prior to Admission  Medication Sig Dispense Refill  . cloNIDine (CATAPRES) 0.1 MG tablet Take 0.2 mg by mouth daily.       Marland Kitchen ibuprofen (ADVIL,MOTRIN) 800 MG tablet Take 800 mg by mouth as needed. pain      . QUEtiapine Fumarate (SEROQUEL XR) 150 MG 24 hr  tablet Take 300 mg by mouth at bedtime.       . traMADol (ULTRAM) 50 MG tablet Take 50 mg by mouth every 4 (four) hours as needed. pain        Results for orders placed during the hospital encounter of 04/17/11 (from the past 48 hour(s))  URINALYSIS, ROUTINE W REFLEX MICROSCOPIC     Status: Abnormal   Collection Time   04/17/11  6:07 PM      Component Value Range Comment   Color, Urine YELLOW  YELLOW     APPearance CLEAR  CLEAR     Specific Gravity, Urine 1.021  1.005 - 1.030     pH 5.5  5.0 - 8.0     Glucose, UA NEGATIVE  NEGATIVE (mg/dL)    Hgb urine dipstick SMALL (*) NEGATIVE     Bilirubin Urine NEGATIVE  NEGATIVE     Ketones, ur NEGATIVE  NEGATIVE (mg/dL)    Protein, ur NEGATIVE  NEGATIVE (mg/dL)    Urobilinogen, UA 0.2  0.0 - 1.0 (mg/dL)    Nitrite NEGATIVE  NEGATIVE     Leukocytes, UA TRACE (*) NEGATIVE    URINE MICROSCOPIC-ADD ON     Status: Abnormal   Collection Time   04/17/11  6:07 PM      Component Value Range Comment   Squamous Epithelial / LPF FEW (*) RARE     WBC, UA 3-6  <3 (WBC/hpf)    RBC / HPF 0-2  <3 (RBC/hpf)    Bacteria, UA FEW (*) RARE     Urine-Other MUCOUS PRESENT     CBC     Status: Abnormal   Collection Time   04/17/11  6:36 PM      Component Value Range Comment   WBC 8.3  4.0 - 10.5 (K/uL)    RBC 5.25  4.22 - 5.81 (MIL/uL)    Hemoglobin 13.1  13.0 - 17.0 (g/dL)    HCT 16.1  09.6 - 04.5 (%)    MCV 76.6 (*) 78.0 - 100.0 (fL)    MCH 25.0 (*) 26.0 - 34.0 (pg)    MCHC 32.6  30.0 - 36.0 (g/dL)    RDW 40.9 (*) 81.1 - 15.5 (%)    Platelets 265  150 - 400 (K/uL)  DIFFERENTIAL     Status: Normal   Collection Time   04/17/11  6:36 PM      Component Value Range Comment   Neutrophils Relative 63  43 - 77 (%)    Neutro Abs 5.2  1.7 - 7.7 (K/uL)    Lymphocytes Relative 27  12 - 46 (%)    Lymphs Abs 2.2  0.7 - 4.0 (K/uL)    Monocytes Relative 8  3 - 12 (%)    Monocytes Absolute 0.6  0.1 - 1.0 (K/uL)    Eosinophils Relative 2  0 - 5 (%)    Eosinophils  Absolute 0.2  0.0 - 0.7 (K/uL)    Basophils Relative 0  0 - 1 (%)    Basophils Absolute 0.0  0.0 - 0.1 (K/uL)   COMPREHENSIVE METABOLIC PANEL     Status: Abnormal   Collection Time   04/17/11  6:36 PM      Component Value Range Comment   Sodium 140  135 - 145 (mEq/L)    Potassium 3.9  3.5 - 5.1 (mEq/L)    Chloride 104  96 - 112 (mEq/L)    CO2 28  19 - 32 (mEq/L)    Glucose, Bld 110 (*) 70 - 99 (mg/dL)    BUN 19  6 - 23 (mg/dL)    Creatinine, Ser 1.61 (*) 0.50 - 1.35 (mg/dL)    Calcium 9.0  8.4 - 10.5 (mg/dL)    Total Protein 7.3  6.0 - 8.3 (g/dL)    Albumin 3.6  3.5 - 5.2 (g/dL)    AST 15  0 - 37 (U/L)    ALT 27  0 - 53 (U/L)    Alkaline Phosphatase 91  39 - 117 (U/L)    Total Bilirubin 0.5  0.3 - 1.2 (mg/dL)    GFR calc non Af Amer 52 (*) >90 (mL/min)    GFR calc Af Amer 60 (*) >90 (mL/min)   LIPASE, BLOOD     Status: Normal   Collection Time   04/17/11  6:36 PM      Component Value Range Comment   Lipase 28  11 - 59 (U/L)    Ct Abdomen Pelvis W Contrast  04/17/2011  *RADIOLOGY REPORT*  Clinical Data: Worsening abdominal pain and vomiting.  Nausea diaphoresis.  Hematuria.  Previous history of urolithiasis and diverticulitis.  CT ABDOMEN AND PELVIS WITH CONTRAST  Technique:  Multidetector CT imaging of the abdomen and pelvis was performed following the standard protocol during bolus administration of intravenous contrast.  Contrast:  100 ml Omnipaque-300 and oral contrast  Comparison: 11/10/2010 and 02/27/09  Findings: Postop changes seen from previous right colectomy. Moderate wall thickening and mild adjacent mesenteric inflammatory changes are seen involving the distal small bowel just proximal to the ileocolostomy anastomosis.  This is consistent with enteritis. No evidence of pneumatosis, abscess or free fluid.  No evidence of bowel obstruction.  The liver, spleen, pancreas, gallbladder, and both adrenal glands are normal in appearance.  No lymphadenopathy identified.  Bilateral  renal parenchymal scarring noted as well as tiny nonobstructing calculus in the upper pole of the right kidney.  No evidence of hydronephrosis.  A 2 cm lesion is seen in the anterior mid pole of the left kidney which has increased in size since previous study.  This could represent a hemorrhagic cyst or solid renal mass.  IMPRESSION:  1.  Distal small bowel wall thickening in the right abdomen, just proximal to the ileocolonic anastomosis.  This  is consistent with enteritis, and differential diagnosis includes Crohn's disease, infectious etiologies, or less likely ischemia. 2.  Increased size of 2 cm indeterminate lesion in the anterior mid pole left kidney.  This could represent a hemorrhagic cyst or solid renal mass. Non-emergent abdomen MRI without with contrast is recommended for further characterization when the patient is able to comply with breath holding instructions.  Original Report Authenticated By: Danae Orleans, M.D.    Review of Systems  Constitutional: Negative.   HENT: Negative.   Eyes: Negative.   Respiratory: Negative.   Cardiovascular: Negative.   Gastrointestinal: Positive for nausea, vomiting and abdominal pain.  Genitourinary: Positive for dysuria.  Musculoskeletal: Negative.   Skin: Negative.   Neurological: Negative.   Endo/Heme/Allergies: Negative.   Psychiatric/Behavioral: Negative.     Blood pressure 145/86, pulse 57, temperature 98.2 F (36.8 C), temperature source Oral, resp. rate 18, height 6' (1.829 m), weight 94.756 kg (208 lb 14.4 oz), SpO2 96.00%. Physical Exam  Constitutional: He is oriented to person, place, and time. He appears well-developed and well-nourished. No distress.  HENT:  Head: Normocephalic and atraumatic.  Right Ear: External ear normal.  Left Ear: External ear normal.  Nose: Nose normal.  Mouth/Throat: Oropharynx is clear and moist. No oropharyngeal exudate.  Eyes: Conjunctivae and EOM are normal. Right eye exhibits no discharge. Left eye  exhibits no discharge. No scleral icterus.       Right eye blind.  Neck: Normal range of motion. Neck supple.  Cardiovascular: Normal rate, regular rhythm and normal heart sounds.   Respiratory: Effort normal and breath sounds normal.  GI: Soft. Bowel sounds are normal.  Musculoskeletal: Normal range of motion. He exhibits no edema and no tenderness.  Neurological: He is alert and oriented to person, place, and time. He has normal reflexes. No cranial nerve deficit. Coordination normal.  Skin: Skin is warm and dry. He is not diaphoretic. No erythema.  Psychiatric: His behavior is normal.     Assessment/Plan #1. Abdominal pain with CAT scan showing features of enteritis and the ileocolonic anastomotic site - patient will be kept n.p.o. and on IV antibiotics with IV hydration. Patient was placed on pain relief medications and gastroenterology has been consulted. Patient denies any recent use of antibiotics and denies any diarrhea we'll get stool studies. #2. Dysuria - we will get urine cultures and patient is already started on ciprofloxacin. #3. Renal failure - probably from dehydration but patient also uses ibuprofen. We'll hydrate and recheck metabolic panel in a.m. #4. History of hypertension - as patient cannot take clonidine we will keep patient on clonidine patch until he can take it orally. And also will be on when necessary doesn't. #5. History of DVT 7 years ago, history of depression and history of gout - no acute issues. #6. Left renal mass - will need further studies including an MRI.  CODE STATUS - full code.  Arthur Raymond N. 04/18/2011, 2:15 AM

## 2011-04-19 LAB — BASIC METABOLIC PANEL
CO2: 27 mEq/L (ref 19–32)
Creatinine, Ser: 1.12 mg/dL (ref 0.50–1.35)
GFR calc Af Amer: 86 mL/min — ABNORMAL LOW (ref >90)

## 2011-04-19 LAB — GLUCOSE, CAPILLARY: Glucose-Capillary: 111 mg/dL — ABNORMAL HIGH (ref 70–99)

## 2011-04-19 LAB — CBC
HCT: 36.8 % — ABNORMAL LOW (ref 39.0–52.0)
Hemoglobin: 11.7 g/dL — ABNORMAL LOW (ref 13.0–17.0)
RDW: 16.2 % — ABNORMAL HIGH (ref 11.5–15.5)
WBC: 6.1 10*3/uL (ref 4.0–10.5)

## 2011-04-19 LAB — URINE CULTURE: Culture: NO GROWTH

## 2011-04-19 MED ORDER — POTASSIUM CHLORIDE CRYS ER 20 MEQ PO TBCR
20.0000 meq | EXTENDED_RELEASE_TABLET | Freq: Two times a day (BID) | ORAL | Status: AC
  Administered 2011-04-19 (×2): 20 meq via ORAL
  Filled 2011-04-19 (×2): qty 1

## 2011-04-19 NOTE — Progress Notes (Signed)
ANTIBIOTIC CONSULT NOTE - FOLLOW UP  Pharmacy Consult for Cipro Indication: enteritis  No Known Allergies  Patient Measurements: Height: 6' (182.9 cm) Weight: 208 lb 14.4 oz (94.756 kg) IBW/kg (Calculated) : 77.6   Vital Signs: Temp: 98.3 F (36.8 C) (12/25 0524) Temp src: Oral (12/25 0524) BP: 161/94 mmHg (12/25 0524) Pulse Rate: 55  (12/25 0524) Intake/Output from previous day: 12/24 0701 - 12/25 0700 In: 5147.1 [P.O.:1320; I.V.:3227.1; IV Piggyback:600] Out: 400 [Urine:400] Intake/Output from this shift:    Labs:  Basename 04/19/11 0546 04/18/11 0525 04/17/11 1836  WBC 6.1 6.3 8.3  HGB 11.7* 11.6* 13.1  PLT 227 232 265  LABCREA -- -- --  CREATININE 1.12 1.30 1.50*   Estimated Creatinine Clearance: 92.2 ml/min (by C-G formula based on Cr of 1.12). No results found for this basename: VANCOTROUGH:2,VANCOPEAK:2,VANCORANDOM:2,GENTTROUGH:2,GENTPEAK:2,GENTRANDOM:2,TOBRATROUGH:2,TOBRAPEAK:2,TOBRARND:2,AMIKACINPEAK:2,AMIKACINTROU:2,AMIKACIN:2, in the last 72 hours   Microbiology: Recent Results (from the past 720 hour(s))  URINE CULTURE     Status: Normal   Collection Time   04/18/11  3:01 AM      Component Value Range Status Comment   Specimen Description URINE, CLEAN CATCH   Final    Special Requests NONE   Final    Setup Time [id number]   Final    Colony Count NO GROWTH   Final    Culture NO GROWTH   Final    Report Status 04/19/2011 FINAL   Final   STOOL CULTURE     Status: Normal (Preliminary result)   Collection Time   04/18/11 10:59 AM      Component Value Range Status Comment   Specimen Description STOOL   Final    Special Requests NONE   Final    Culture Culture reincubated for better growth   Final    Report Status PENDING   Incomplete   CLOSTRIDIUM DIFFICILE BY PCR     Status: Normal   Collection Time   04/18/11 10:59 AM      Component Value Range Status Comment   C difficile by pcr NEGATIVE  NEGATIVE  Final     Anti-infectives     Start      Dose/Rate Route Frequency Ordered Stop   04/18/11 0800   ciprofloxacin (CIPRO) IVPB 400 mg        400 mg 200 mL/hr over 60 Minutes Intravenous Every 12 hours 04/18/11 0250     04/18/11 0215   metroNIDAZOLE (FLAGYL) IVPB 500 mg        500 mg 100 mL/hr over 60 Minutes Intravenous Every 8 hours 04/18/11 0211     04/17/11 2130   ciprofloxacin (CIPRO) IVPB 400 mg        400 mg 200 mL/hr over 60 Minutes Intravenous  Once 04/17/11 2116 04/17/11 2227   04/17/11 2130   metroNIDAZOLE (FLAGYL) IVPB 500 mg        500 mg 100 mL/hr over 60 Minutes Intravenous  Once 04/17/11 2116 04/18/11 0005          Assessment: Patient on day #2 of Cipro and Flagyl for enteritis. Noted afebrile, WBC WNL. Scr trending down.  Plan:  1. Continue IV Cipro 400mg  q12h-pharmacy signs off, please reconsult if needed. 2. Please consider changing Cipro to 500mg  PO bid and Flagyl to 500mg  PO q8h.  Aislin Onofre K. Allena Katz, PharmD, BCPS.  Clinical Pharmacist Pager [phone]. 04/19/2011 11:23 AM

## 2011-04-19 NOTE — Progress Notes (Signed)
[facility] Progress Note  Subjective: Still some abdominal pain mainly in the right lower quadrant overall slightly better but requiring Dilaudid.  Objective: Vital signs in last 24 hours: Temp:  [98 F (36.7 C)-98.3 F (36.8 C)] 98.3 F (36.8 C) (12/25 0524) Pulse Rate:  [55-62] 55  (12/25 0524) Resp:  [17-18] 18  (12/25 0524) BP: (154-164)/(90-96) 161/94 mmHg (12/25 0524) SpO2:  [97 %-99 %] 97 % (12/25 0524) Weight change:    PE: Mild to moderate right mid abdominal pain without rebound.  Lab Results: Results for orders placed during the hospital encounter of 04/17/11 (from the past 24 hour(s))  BASIC METABOLIC PANEL     Status: Abnormal   Collection Time   04/19/11  5:46 AM      Component Value Range   Sodium 141  135 - 145 (mEq/L)   Potassium 3.3 (*) 3.5 - 5.1 (mEq/L)   Chloride 107  96 - 112 (mEq/L)   CO2 27  19 - 32 (mEq/L)   Glucose, Bld 88  70 - 99 (mg/dL)   BUN 8  6 - 23 (mg/dL)   Creatinine, Ser 7.84  0.50 - 1.35 (mg/dL)   Calcium 8.6  8.4 - 69.6 (mg/dL)   GFR calc non Af Amer 74 (*) >90 (mL/min)   GFR calc Af Amer 86 (*) >90 (mL/min)  CBC     Status: Abnormal   Collection Time   04/19/11  5:46 AM      Component Value Range   WBC 6.1  4.0 - 10.5 (K/uL)   RBC 4.67  4.22 - 5.81 (MIL/uL)   Hemoglobin 11.7 (*) 13.0 - 17.0 (g/dL)   HCT 29.5 (*) 28.4 - 52.0 (%)   MCV 78.8  78.0 - 100.0 (fL)   MCH 25.1 (*) 26.0 - 34.0 (pg)   MCHC 31.8  30.0 - 36.0 (g/dL)   RDW 13.2 (*) 44.0 - 15.5 (%)   Platelets 227  150 - 400 (K/uL)  GLUCOSE, CAPILLARY     Status: Normal   Collection Time   04/19/11 12:13 PM      Component Value Range   Glucose-Capillary 75  70 - 99 (mg/dL)    Studies/Results: Stool for C. difficile is negative, stool culture is pending.     Assessment: Terminal ileitis, unclear whether acute infectious or do to Crohn's disease.  Plan: 1. Await pending stool studies 2. Continue Cipro 3. Try full liquid diet 4. If improves enough for  discharge he prefers followup with his gastroenterologist and [place] for eventual colonoscopy.    Ermias Tomeo C 04/19/2011, 1:20 PM

## 2011-04-19 NOTE — Progress Notes (Signed)
Subjective: Still reports rectal quadrant pain. Afebrile overnight. Had one loose bowel movement today.  Objective: Vital signs in last 24 hours: Filed Vitals:   04/18/11 1448 04/18/11 2140 04/18/11 2152 04/19/11 0524  BP: 164/96  154/90 161/94  Pulse: 61 62  55  Temp: 98 F (36.7 C) 98.1 F (36.7 C)  98.3 F (36.8 C)  TempSrc: Oral   Oral  Resp: 18 17  18   Height:      Weight:      SpO2: 99% 97%  97%    Intake/Output Summary (Last 24 hours) at 04/19/11 0943 Last data filed at 04/19/11 0700  Gross per 24 hour  Intake 5147.08 ml  Output    400 ml  Net 4747.08 ml    Weight change:    General: Alert, awake, oriented x3, in no acute distress. HEENT: No bruits, no goiter. Heart: Regular rate and rhythm, without murmurs, rubs, gallops. Lungs: Clear to auscultation bilaterally. Abdomen: Tender to palpation the right lower quadrant Extremities: No clubbing cyanosis or edema with positive pedal pulses. Neuro: Grossly intact, nonfocal.   Lab Results: Results for orders placed during the hospital encounter of 04/17/11 (from the past 24 hour(s))  STOOL CULTURE     Status: Normal (Preliminary result)   Collection Time   04/18/11 10:59 AM      Component Value Range   Specimen Description STOOL     Special Requests NONE     Culture Culture reincubated for better growth     Report Status PENDING    CLOSTRIDIUM DIFFICILE BY PCR     Status: Normal   Collection Time   04/18/11 10:59 AM      Component Value Range   C difficile by pcr NEGATIVE  NEGATIVE   BASIC METABOLIC PANEL     Status: Abnormal   Collection Time   04/19/11  5:46 AM      Component Value Range   Sodium 141  135 - 145 (mEq/L)   Potassium 3.3 (*) 3.5 - 5.1 (mEq/L)   Chloride 107  96 - 112 (mEq/L)   CO2 27  19 - 32 (mEq/L)   Glucose, Bld 88  70 - 99 (mg/dL)   BUN 8  6 - 23 (mg/dL)   Creatinine, Ser 1.61  0.50 - 1.35 (mg/dL)   Calcium 8.6  8.4 - 09.6 (mg/dL)   GFR calc non Af Amer 74 (*) >90 (mL/min)   GFR  calc Af Amer 86 (*) >90 (mL/min)  CBC     Status: Abnormal   Collection Time   04/19/11  5:46 AM      Component Value Range   WBC 6.1  4.0 - 10.5 (K/uL)   RBC 4.67  4.22 - 5.81 (MIL/uL)   Hemoglobin 11.7 (*) 13.0 - 17.0 (g/dL)   HCT 04.5 (*) 40.9 - 52.0 (%)   MCV 78.8  78.0 - 100.0 (fL)   MCH 25.1 (*) 26.0 - 34.0 (pg)   MCHC 31.8  30.0 - 36.0 (g/dL)   RDW 81.1 (*) 91.4 - 15.5 (%)   Platelets 227  150 - 400 (K/uL)     Micro: Recent Results (from the past 240 hour(s))  URINE CULTURE     Status: Normal   Collection Time   04/18/11  3:01 AM      Component Value Range Status Comment   Specimen Description URINE, CLEAN CATCH   Final    Special Requests NONE   Final    Setup Time [phone]  Final    Colony Count NO GROWTH   Final    Culture NO GROWTH   Final    Report Status 04/19/2011 FINAL   Final   STOOL CULTURE     Status: Normal (Preliminary result)   Collection Time   04/18/11 10:59 AM      Component Value Range Status Comment   Specimen Description STOOL   Final    Special Requests NONE   Final    Culture Culture reincubated for better growth   Final    Report Status PENDING   Incomplete   CLOSTRIDIUM DIFFICILE BY PCR     Status: Normal   Collection Time   04/18/11 10:59 AM      Component Value Range Status Comment   C difficile by pcr NEGATIVE  NEGATIVE  Final     Studies/Results: Mr Abdomen W Wo Contrast  04/18/2011  *RADIOLOGY REPORT*  Clinical Data: Evaluate renal mass  MRI ABDOMEN WITH AND WITHOUT CONTRAST  Technique:  Multiplanar multisequence MR imaging of the abdomen was performed both before and after administration of intravenous contrast.  Contrast: 20mL MULTIHANCE GADOBENATE DIMEGLUMINE 529 MG/ML IV SOLN  Comparison: CT 04/17/2011  Findings:  Renal:  Lesion of concern in the left kidney measures 17 x 16 mm (image 53, series 1301).  This lesion is isointense on T2-weighted imaging and isointense on T1-weighted imaging .  There is no postcontrast  enhancement of this lesion which is consistent with a Bosniak II, benign renal cysts.  There is a second smaller nonenhancing hemorrhagic cyst measuring 12 mm (image 73).  There is a Bosniak I cyst within the mid left kidney measuring 9 mm (image 63).  No focal hepatic lesion.  Within the right hepatic lobe there is a subcapsular triangular lesion which demonstrates early uniform arterial enhancement measuring 20 mm x 19 mm.  This lesion and is readily evident on the noncontrast pulse sequences.    This lesion in right hepatic lobe is isointense on the more delayed vascular sequences and therefore favor this to represent a vascular phenomenon such as arterial portal shunting. There is a similar less well-defined lesion inferior right hepatic lobe (image 47, series 1301).  There is a nonenhancing simple hepatic cyst in the anterior medial left hepatic lobe measuring 10 mm (image 5, series 6).  The pancreas, spleen, adrenal glands, stomach, and limited view of the small bowel and colon appear normal.  No periportal retroperitoneal lymphadenopathy.  IMPRESSION:  1.  Bosniak II nonenhancing cyst within the left kidney corresponds to the lesion of concern on comparison CT.  This lesion requires no specific follow-up. 2.  Additional Bosniak I and II cysts within the left kidney. 3.  Early arterial enhancing lesion in the right hepatic lobe which is only evident on one series is felt to most likely represent a benign vascular phenomenon.  Original Report Authenticated By: Genevive Bi, M.D.   Ct Abdomen Pelvis W Contrast  04/17/2011  *RADIOLOGY REPORT*  Clinical Data: Worsening abdominal pain and vomiting.  Nausea diaphoresis.  Hematuria.  Previous history of urolithiasis and diverticulitis.  CT ABDOMEN AND PELVIS WITH CONTRAST  Technique:  Multidetector CT imaging of the abdomen and pelvis was performed following the standard protocol during bolus administration of intravenous contrast.  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 Moderate wall thickening and mild adjacent mesenteric inflammatory changes are seen involving the distal small bowel  just proximal to the ileocolostomy anastomosis.  This is consistent with enteritis. No evidence of pneumatosis, abscess or free fluid.  No evidence of bowel obstruction.  The liver, spleen, pancreas, gallbladder, and both adrenal glands are normal in appearance.  No lymphadenopathy identified.  Bilateral renal parenchymal scarring noted as well as tiny nonobstructing calculus in the upper pole of the right kidney.  No evidence of hydronephrosis.  A 2 cm lesion is seen in the anterior mid pole of the left kidney which has increased in size since previous study.  This could represent a hemorrhagic cyst or solid renal mass.  IMPRESSION:  1.  Distal small bowel wall thickening in the right abdomen, just proximal to the ileocolonic anastomosis.  This is consistent with enteritis, and differential diagnosis includes Crohn's disease, infectious etiologies, or less likely ischemia. 2.  Increased size of 2 cm indeterminate lesion in the anterior mid pole left kidney.  This could represent a hemorrhagic cyst or solid renal mass. Non-emergent abdomen MRI without with contrast is recommended for further characterization when the patient is able to comply with breath holding instructions.  Original Report Authenticated By: Danae Orleans, M.D.    Medications:  Scheduled Meds:   . ciprofloxacin  400 mg Intravenous Q12H  . cloNIDine  0.1 mg Transdermal Weekly  . metronidazole  500 mg Intravenous Q8H   Continuous Infusions:   . sodium chloride 125 mL/hr at 04/19/11 0148   PRN Meds:.acetaminophen, acetaminophen, gadobenate dimeglumine, hydrALAZINE, HYDROmorphone (DILAUDID) injection, LORazepam, ondansetron (ZOFRAN) IV, ondansetron   Assessment:  #1 abdominal painTerminal  ileitis by CT scan with nausea vomiting abdominal pain, suggestive of either an acute infectious enteritis or if recurrent episodes, Crohn's disease more likely.Continue Cipro and bowel rest. Await pending stool studies. Will likely need colonoscopy at some point either during this admission or after discharge #2 diarrhea C. difficile negative  #3 hypokalemia replete #4 hypertension continue clonidine       LOS: 2 days   [facility] 04/19/2011, 9:43 AM

## 2011-04-20 ENCOUNTER — Inpatient Hospital Stay (HOSPITAL_COMMUNITY): Payer: Self-pay

## 2011-04-20 LAB — BASIC METABOLIC PANEL
GFR calc non Af Amer: 84 mL/min — ABNORMAL LOW (ref >90)
Glucose, Bld: 101 mg/dL — ABNORMAL HIGH (ref 70–99)
Potassium: 3.6 mEq/L (ref 3.5–5.1)
Sodium: 138 mEq/L (ref 135–145)

## 2011-04-20 LAB — CBC
Hemoglobin: 12.6 g/dL — ABNORMAL LOW (ref 13.0–17.0)
MCH: 25 pg — ABNORMAL LOW (ref 26.0–34.0)
RBC: 5.05 MIL/uL (ref 4.22–5.81)

## 2011-04-20 NOTE — Progress Notes (Signed)
Subjective: Worsening abdominal pain today, nauseous this morning  Objective: Vital signs in last 24 hours: Filed Vitals:   04/19/11 2216 04/20/11 0450 04/20/11 0500 04/20/11 0658  BP: 148/91 167/106  135/87  Pulse: 69 69 87   Temp:  99.1 F (37.3 C)    TempSrc:  Oral    Resp:  20    Height:      Weight:      SpO2:  100%      Intake/Output Summary (Last 24 hours) at 04/20/11 1024 Last data filed at 04/20/11 0600  Gross per 24 hour  Intake 3024.17 ml  Output      0 ml  Net 3024.17 ml    Weight change:   General: Alert, awake, oriented x3, in no acute distress. HEENT: No bruits, no goiter. Heart: Regular rate and rhythm, without murmurs, rubs, gallops. Lungs: Clear to auscultation bilaterally. Abdomen: Tender to palpation in the left and right lower quadrant  Extremities: No clubbing cyanosis or edema with positive pedal pulses. Neuro: Grossly intact, nonfocal.    Lab Results: Results for orders placed during the hospital encounter of 04/17/11 (from the past 24 hour(s))  GLUCOSE, CAPILLARY     Status: Normal   Collection Time   04/19/11 12:13 PM      Component Value Range   Glucose-Capillary 75  70 - 99 (mg/dL)  GLUCOSE, CAPILLARY     Status: Abnormal   Collection Time   04/19/11  6:19 PM      Component Value Range   Glucose-Capillary 111 (*) 70 - 99 (mg/dL)   Comment 1 Notify RN    BASIC METABOLIC PANEL     Status: Abnormal   Collection Time   04/20/11  6:00 AM      Component Value Range   Sodium 138  135 - 145 (mEq/L)   Potassium 3.6  3.5 - 5.1 (mEq/L)   Chloride 104  96 - 112 (mEq/L)   CO2 25  19 - 32 (mEq/L)   Glucose, Bld 101 (*) 70 - 99 (mg/dL)   BUN 5 (*) 6 - 23 (mg/dL)   Creatinine, Ser 4.09  0.50 - 1.35 (mg/dL)   Calcium 9.4  8.4 - 81.1 (mg/dL)   GFR calc non Af Amer 84 (*) >90 (mL/min)   GFR calc Af Amer >90  >90 (mL/min)  CBC     Status: Abnormal   Collection Time   04/20/11  6:00 AM      Component Value Range   WBC 9.3  4.0 - 10.5 (K/uL)    RBC 5.05  4.22 - 5.81 (MIL/uL)   Hemoglobin 12.6 (*) 13.0 - 17.0 (g/dL)   HCT 91.4 (*) 78.2 - 52.0 (%)   MCV 77.0 (*) 78.0 - 100.0 (fL)   MCH 25.0 (*) 26.0 - 34.0 (pg)   MCHC 32.4  30.0 - 36.0 (g/dL)   RDW 95.6 (*) 21.3 - 15.5 (%)   Platelets 255  150 - 400 (K/uL)     Micro: Recent Results (from the past 240 hour(s))  URINE CULTURE     Status: Normal   Collection Time   04/18/11  3:01 AM      Component Value Range Status Comment   Specimen Description URINE, CLEAN CATCH   Final    Special Requests NONE   Final    Setup Time [id number]   Final    Colony Count NO GROWTH   Final    Culture NO GROWTH   Final  Report Status 04/19/2011 FINAL   Final   STOOL CULTURE     Status: Normal (Preliminary result)   Collection Time   04/18/11 10:59 AM      Component Value Range Status Comment   Specimen Description STOOL   Final    Special Requests NONE   Final    Culture NO SUSPICIOUS COLONIES, CONTINUING TO HOLD   Final    Report Status PENDING   Incomplete   CLOSTRIDIUM DIFFICILE BY PCR     Status: Normal   Collection Time   04/18/11 10:59 AM      Component Value Range Status Comment   C difficile by pcr NEGATIVE  NEGATIVE  Final     Studies/Results: Mr Abdomen W Wo Contrast  04/18/2011  *RADIOLOGY REPORT*  Clinical Data: Evaluate renal mass  MRI ABDOMEN WITH AND WITHOUT CONTRAST  Technique:  Multiplanar multisequence MR imaging of the abdomen was performed both before and after administration of intravenous contrast.  Contrast: 20mL MULTIHANCE GADOBENATE DIMEGLUMINE 529 MG/ML IV SOLN  Comparison: CT 04/17/2011  Findings:  Renal:  Lesion of concern in the left kidney measures 17 x 16 mm (image 53, series 1301).  This lesion is isointense on T2-weighted imaging and isointense on T1-weighted imaging .  There is no postcontrast enhancement of this lesion which is consistent with a Bosniak II, benign renal cysts.  There is a second smaller nonenhancing hemorrhagic cyst measuring 12 mm  (image 73).  There is a Bosniak I cyst within the mid left kidney measuring 9 mm (image 63).  No focal hepatic lesion.  Within the right hepatic lobe there is a subcapsular triangular lesion which demonstrates early uniform arterial enhancement measuring 20 mm x 19 mm.  This lesion and is readily evident on the noncontrast pulse sequences.    This lesion in right hepatic lobe is isointense on the more delayed vascular sequences and therefore favor this to represent a vascular phenomenon such as arterial portal shunting. There is a similar less well-defined lesion inferior right hepatic lobe (image 47, series 1301).  There is a nonenhancing simple hepatic cyst in the anterior medial left hepatic lobe measuring 10 mm (image 5, series 6).  The pancreas, spleen, adrenal glands, stomach, and limited view of the small bowel and colon appear normal.  No periportal retroperitoneal lymphadenopathy.  IMPRESSION:  1.  Bosniak II nonenhancing cyst within the left kidney corresponds to the lesion of concern on comparison CT.  This lesion requires no specific follow-up. 2.  Additional Bosniak I and II cysts within the left kidney. 3.  Early arterial enhancing lesion in the right hepatic lobe which is only evident on one series is felt to most likely represent a benign vascular phenomenon.  Original Report Authenticated By: Genevive Bi, M.D.    Medications:  Scheduled Meds:   . ciprofloxacin  400 mg Intravenous Q12H  . cloNIDine  0.1 mg Transdermal Weekly  . metronidazole  500 mg Intravenous Q8H  . potassium chloride  20 mEq Oral BID   Continuous Infusions:   . sodium chloride 75 mL/hr at 04/20/11 0418   PRN Meds:.acetaminophen, acetaminophen, hydrALAZINE, HYDROmorphone (DILAUDID) injection, LORazepam, ondansetron (ZOFRAN) IV, ondansetron   Assessment:   #1 abdominal painTerminal ileitis by CT scan with nausea vomiting abdominal pain, suggestive of either an acute infectious enteritis or if recurrent  episodes, Crohn's disease more likely.Continue Cipro and bowel rest. Await pending stool studies. Will likely need colonoscopy at some point either during this admission or after discharge .  His abdominal pain is worse today. We'll transition him to n.p.o. Will consider repeat CT of okay with GI.  #2 diarrhea C. difficile negative  #3 hypokalemia replete  #4 hypertension continue clonidine     LOS: 3 days   [facility] 04/20/2011, 10:24 AM

## 2011-04-20 NOTE — Progress Notes (Signed)
   CARE MANAGEMENT NOTE 04/20/2011  Patient:  Arthur Raymond,Arthur Raymond   Account Number:  [id number]  Date Initiated:  04/20/2011  Documentation initiated by:  Junius Creamer  Subjective/Objective Assessment:   adm w abd pain     Action/Plan:   lives w wife   Anticipated DC Date:  04/22/2011   Anticipated DC Plan:  HOME/SELF CARE      DC Planning Services  CM consult  [facility]t [facility]      Choice offered to / List presented to:             Status of service:   Medicare Important Message given?   (If response is "NO", the following Medicare IM given date fields will be blank) Date Medicare IM given:   Date Additional Medicare IM given:    Discharge Disposition:  HOME/SELF CARE  Per UR Regulation:  Reviewed for med. necessity/level of care/duration of stay  Comments:  12/26 spoke w pt and gave inform about [facility]) pt agreeable to appt, appt for elig on 1-23 at 9am, appt w dr Sherron Flemings at [facility] on 1-31 at 10:30am, debbie Xochitl Egle rn,bsn [phone]

## 2011-04-21 LAB — URINALYSIS, ROUTINE W REFLEX MICROSCOPIC
Bilirubin Urine: NEGATIVE
Hgb urine dipstick: NEGATIVE
Ketones, ur: NEGATIVE mg/dL
Nitrite: POSITIVE — AB
Urobilinogen, UA: 0.2 mg/dL (ref 0.0–1.0)
pH: 6 (ref 5.0–8.0)

## 2011-04-21 LAB — BASIC METABOLIC PANEL
CO2: 26 mEq/L (ref 19–32)
Calcium: 9 mg/dL (ref 8.4–10.5)
Creatinine, Ser: 1.13 mg/dL (ref 0.50–1.35)
Glucose, Bld: 91 mg/dL (ref 70–99)

## 2011-04-21 LAB — CBC
MCH: 24.7 pg — ABNORMAL LOW (ref 26.0–34.0)
MCV: 77.5 fL — ABNORMAL LOW (ref 78.0–100.0)
Platelets: 245 10*3/uL (ref 150–400)
RDW: 16 % — ABNORMAL HIGH (ref 11.5–15.5)

## 2011-04-21 LAB — URINE MICROSCOPIC-ADD ON

## 2011-04-21 MED ORDER — METRONIDAZOLE 500 MG PO TABS
500.0000 mg | ORAL_TABLET | Freq: Three times a day (TID) | ORAL | Status: DC
  Administered 2011-04-21 – 2011-04-23 (×6): 500 mg via ORAL
  Filled 2011-04-21 (×9): qty 1

## 2011-04-21 MED ORDER — AMLODIPINE BESYLATE 5 MG PO TABS
5.0000 mg | ORAL_TABLET | Freq: Every day | ORAL | Status: DC
  Administered 2011-04-21 – 2011-04-22 (×2): 5 mg via ORAL
  Filled 2011-04-21 (×2): qty 1

## 2011-04-21 MED ORDER — POTASSIUM CHLORIDE 20 MEQ/15ML (10%) PO LIQD
40.0000 meq | Freq: Once | ORAL | Status: AC
  Administered 2011-04-21: 40 meq via ORAL
  Filled 2011-04-21: qty 30

## 2011-04-21 MED ORDER — CIPROFLOXACIN HCL 500 MG PO TABS
500.0000 mg | ORAL_TABLET | Freq: Two times a day (BID) | ORAL | Status: DC
  Administered 2011-04-21 – 2011-04-23 (×5): 500 mg via ORAL
  Filled 2011-04-21 (×7): qty 1

## 2011-04-21 NOTE — Progress Notes (Signed)
[facility] Progress Note  Subjective: Patient had some vomiting yesterday with intermittent abdominal pain but feels better today. He has some hunger and would like to try eating again.  Objective: Vital signs in last 24 hours: Temp:  [98.6 F (37 Raymond)-99.1 F (37.3 Raymond)] 98.9 F (37.2 Raymond) (12/27 0523) Pulse Rate:  [61-77] 61  (12/27 0523) Resp:  [18-20] 18  (12/27 0523) BP: (135-163)/(80-96) 135/80 mmHg (12/27 0523) SpO2:  [97 %-99 %] 98 % (12/27 0523) Weight change:    PE: Abdomen mildly tender diffusely  Lab Results: Results for orders placed during the hospital encounter of 04/17/11 (from the past 24 hour(s))  BASIC METABOLIC PANEL     Status: Abnormal   Collection Time   04/21/11  5:50 AM      Component Value Range   Sodium 141  135 - 145 (mEq/L)   Potassium 3.4 (*) 3.5 - 5.1 (mEq/L)   Chloride 105  96 - 112 (mEq/L)   CO2 26  19 - 32 (mEq/L)   Glucose, Bld 91  70 - 99 (mg/dL)   BUN 7  6 - 23 (mg/dL)   Creatinine, Ser 1.02  0.50 - 1.35 (mg/dL)   Calcium 9.0  8.4 - 72.5 (mg/dL)   GFR calc non Af Amer 73 (*) >90 (mL/min)   GFR calc Af Amer 85 (*) >90 (mL/min)  CBC     Status: Abnormal   Collection Time   04/21/11  5:50 AM      Component Value Range   WBC 7.1  4.0 - 10.5 (K/uL)   RBC 4.85  4.22 - 5.81 (MIL/uL)   Hemoglobin 12.0 (*) 13.0 - 17.0 (g/dL)   HCT 36.6 (*) 44.0 - 52.0 (%)   MCV 77.5 (*) 78.0 - 100.0 (fL)   MCH 24.7 (*) 26.0 - 34.0 (pg)   MCHC 31.9  30.0 - 36.0 (g/dL)   RDW 34.7 (*) 42.5 - 15.5 (%)   Platelets 245  150 - 400 (K/uL)    Studies/Results: Dg Abd 1 View  04/20/2011  *RADIOLOGY REPORT*  Clinical Data: Abdominal pain, recent colon surgery  ABDOMEN - 1 VIEW  Comparison: CT abdomen pelvis of 04/17/2011  Findings: A supine film of the abdomen shows surgical sutures in the right abdomen.  However, no bowel obstruction is seen.  No opaque calculi are noted.  Free intraperitoneal air cannot be evaluated on this supine film and if further  assessment is warranted either an erect view of the abdomen or left lateral decubitus of the abdomen would be recommended.  IMPRESSION: No bowel obstruction.  Surgical sutures in the right abdomen. Cannot assess for free air on this supine film.  See above.  Original Report Authenticated By: Juline Patch, M.D.      Assessment: Abdominal pain and diarrhea with some recent vomiting, terminal ileitis suggested on CT scan.  Plan: Continue antibiotics and switch to oral as tolerated. Offered colonoscopy while in hospital. The patient would like to continue to try to see if he improved on current therapy, hopefully go home and followup with his primary gastroenterologist and [place] who is Dr. Vernell Barrier    Arthur Raymond 04/21/2011, 9:42 AM

## 2011-04-21 NOTE — Progress Notes (Signed)
Utilization Review Completed.Arthur Raymond T12/27/2012   

## 2011-04-21 NOTE — Progress Notes (Addendum)
Patient ID: Arthur Raymond, male   DOB: 05-13-1960, 50 y.o.   MRN: [id number] Subjective: Vomiting yesterday.  Pain better as he has been NPO over night.  Urination is painful.  Urine dark.  Objective: Vital signs in last 24 hours: Filed Vitals:   04/20/11 1338 04/20/11 1550 04/20/11 2145 04/21/11 0523  BP: 162/90 148/88 163/96 135/80  Pulse: 77  67 61  Temp: 98.6 F (37 C)  99.1 F (37.3 C) 98.9 F (37.2 C)  TempSrc: Oral  Oral Oral  Resp: 20  20 18   Height:      Weight:      SpO2: 97%  99% 98%    Intake/Output Summary (Last 24 hours) at 04/21/11 0901 Last data filed at 04/21/11 4098  Gross per 24 hour  Intake   1615 ml  Output      0 ml  Net   1615 ml    Weight change:   General: Alert, awake, oriented x3, in no acute distress. Rt eye discolored. HEENT: No bruits, no goiter. Heart: Regular rate and rhythm, without murmurs, rubs, gallops. Lungs: Clear to auscultation bilaterally. Abdomen: soft, Tender to palpation in all quads except upper left. Decreased bowel sounds. Extremities: No clubbing cyanosis or edema with positive pedal pulses. Neuro: Grossly intact, nonfocal.   Lab Results: Results for orders placed during the hospital encounter of 04/17/11 (from the past 24 hour(s))  BASIC METABOLIC PANEL     Status: Abnormal   Collection Time   04/21/11  5:50 AM      Component Value Range   Sodium 141  135 - 145 (mEq/L)   Potassium 3.4 (*) 3.5 - 5.1 (mEq/L)   Chloride 105  96 - 112 (mEq/L)   CO2 26  19 - 32 (mEq/L)   Glucose, Bld 91  70 - 99 (mg/dL)   BUN 7  6 - 23 (mg/dL)   Creatinine, Ser 1.19  0.50 - 1.35 (mg/dL)   Calcium 9.0  8.4 - 14.7 (mg/dL)   GFR calc non Af Amer 73 (*) >90 (mL/min)   GFR calc Af Amer 85 (*) >90 (mL/min)  CBC     Status: Abnormal   Collection Time   04/21/11  5:50 AM      Component Value Range   WBC 7.1  4.0 - 10.5 (K/uL)   RBC 4.85  4.22 - 5.81 (MIL/uL)   Hemoglobin 12.0 (*) 13.0 - 17.0 (g/dL)   HCT 82.9 (*) 56.2 - 52.0 (%)   MCV  77.5 (*) 78.0 - 100.0 (fL)   MCH 24.7 (*) 26.0 - 34.0 (pg)   MCHC 31.9  30.0 - 36.0 (g/dL)   RDW 13.0 (*) 86.5 - 15.5 (%)   Platelets 245  150 - 400 (K/uL)     Micro: Recent Results (from the past 240 hour(s))  URINE CULTURE     Status: Normal   Collection Time   04/18/11  3:01 AM      Component Value Range Status Comment   Specimen Description URINE, CLEAN CATCH   Final    Special Requests NONE   Final    Setup Time [id number]   Final    Colony Count NO GROWTH   Final    Culture NO GROWTH   Final    Report Status 04/19/2011 FINAL   Final   STOOL CULTURE     Status: Normal (Preliminary result)   Collection Time   04/18/11 10:59 AM      Component Value Range Status Comment  Specimen Description STOOL   Final    Special Requests NONE   Final    Culture NO SUSPICIOUS COLONIES, CONTINUING TO HOLD   Final    Report Status PENDING   Incomplete   CLOSTRIDIUM DIFFICILE BY PCR     Status: Normal   Collection Time   04/18/11 10:59 AM      Component Value Range Status Comment   C difficile by pcr NEGATIVE  NEGATIVE  Final     Studies/Results: Dg Abd 1 View  04/20/2011  *RADIOLOGY REPORT*  Clinical Data: Abdominal pain, recent colon surgery  ABDOMEN - 1 VIEW   IMPRESSION: No bowel obstruction.  Surgical sutures in the right abdomen. Cannot assess for free air on this supine film.  See above.  Original Report Authenticated By: Juline Patch, M.D.    Medications:  Scheduled Meds:    . ciprofloxacin  400 mg Intravenous Q12H  . cloNIDine  0.1 mg Transdermal Weekly  . metronidazole  500 mg Intravenous Q8H   Continuous Infusions:    . sodium chloride 75 mL/hr at 04/20/11 2039   PRN Meds:.acetaminophen, acetaminophen, hydrALAZINE, HYDROmorphone (DILAUDID) injection, LORazepam, ondansetron (ZOFRAN) IV, ondansetron   Assessment:   #1 abdominal painTerminal ileitis by CT scan with nausea vomiting abdominal pain, suggestive of either an acute infectious enteritis or if  recurrent episodes, Crohn's disease more likely. Change Cipro and Flagyl to PO.  Will advance from NPO to clear liquids and await GI recommendations.    #2 diarrhea C. difficile negative  #3 hypokalemia replete, recheck bmet in am #4 hypertension continue clonidine, add norvasc 5 mg. #5 dark painful urine, repeat U/A. 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   Addendum:  Dr Susie Cassette saw the patient.  He now desires to go thru with inpatient colonscopy.  I called [facility] office.  Belenda Cruise will relay to Dr. Madilyn Fireman that the patient desires inpatient procedure.   LOS: 4 days   Stephani Police 04/21/2011, 9:01 AM [phone]

## 2011-04-21 NOTE — Progress Notes (Signed)
Patient seen and examined with Algis Downs. Agree with her assessment and plan.

## 2011-04-22 LAB — BASIC METABOLIC PANEL
BUN: 7 mg/dL (ref 6–23)
Chloride: 105 mEq/L (ref 96–112)
GFR calc Af Amer: 80 mL/min — ABNORMAL LOW (ref >90)
Potassium: 3.7 mEq/L (ref 3.5–5.1)
Sodium: 138 mEq/L (ref 135–145)

## 2011-04-22 LAB — CBC
HCT: 37 % — ABNORMAL LOW (ref 39.0–52.0)
Hemoglobin: 11.9 g/dL — ABNORMAL LOW (ref 13.0–17.0)
MCHC: 32.2 g/dL (ref 30.0–36.0)
RBC: 4.76 MIL/uL (ref 4.22–5.81)

## 2011-04-22 LAB — STOOL CULTURE
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
## 2011-04-22 NOTE — Progress Notes (Signed)
Patient seen and examined. Case discussed with the physician's assistant Algis Downs. I agree with examination assessment and plan.

## 2011-04-23 DIAGNOSIS — R197 Diarrhea, unspecified: Secondary | ICD-10-CM | POA: Diagnosis present

## 2011-04-23 LAB — URINE CULTURE
Colony Count: NO GROWTH
Culture  Setup Time: 201212281244
Culture: NO GROWTH

## 2011-04-23 LAB — BASIC METABOLIC PANEL
BUN: 10 mg/dL (ref 6–23)
Calcium: 9.1 mg/dL (ref 8.4–10.5)
Chloride: 103 mEq/L (ref 96–112)
Creatinine, Ser: 1.19 mg/dL (ref 0.50–1.35)
GFR calc Af Amer: 80 mL/min — ABNORMAL LOW (ref >90)
GFR calc non Af Amer: 69 mL/min — ABNORMAL LOW (ref >90)

## 2011-04-23 LAB — CBC
MCHC: 31.9 g/dL (ref 30.0–36.0)
Platelets: 260 10*3/uL (ref 150–400)
RDW: 16.2 % — ABNORMAL HIGH (ref 11.5–15.5)
WBC: 8.7 10*3/uL (ref 4.0–10.5)

## 2011-04-23 MED ORDER — CIPROFLOXACIN HCL 500 MG PO TABS: 500.0000 mg | ORAL_TABLET | Freq: Two times a day (BID) | ORAL | Status: AC

## 2011-04-23 MED ORDER — CLONIDINE HCL 0.1 MG PO TABS: 0.2000 mg | ORAL_TABLET | Freq: Two times a day (BID) | ORAL | Status: DC

## 2011-04-23 MED ORDER — IBUPROFEN 800 MG PO TABS: 800.0000 mg | ORAL_TABLET | Freq: Three times a day (TID) | ORAL | Status: DC | PRN

## 2011-04-23 MED ORDER — CIPROFLOXACIN HCL 500 MG PO TABS: 500.0000 mg | ORAL_TABLET | Freq: Two times a day (BID) | ORAL | Status: DC

## 2011-04-23 MED ORDER — METRONIDAZOLE 500 MG PO TABS: 500.0000 mg | ORAL_TABLET | Freq: Three times a day (TID) | ORAL | Status: AC

## 2011-04-23 NOTE — Discharge Summary (Signed)
Arthur Raymond MRN: [id number] DOB/AGE: 01-04-1961 50 y.o.  Admit date: 04/17/2011 Discharge date: 04/23/2011  Primary Care Physician:  No primary provider on file.   Discharge Diagnoses:   Patient Active Problem List  Diagnoses  . HTN (hypertension)  . Dysuria  . ARF (acute renal failure)  . Abdominal pain  . Diarrhea    DISCHARGE MEDICATION: Current Discharge Medication List    START taking these medications   Details  ciprofloxacin (CIPRO) 500 MG tablet Take 1 tablet (500 mg total) by mouth 2 (two) times daily. Qty: 10 tablet, Refills: 0    metroNIDAZOLE (FLAGYL) 500 MG tablet Take 1 tablet (500 mg total) by mouth every 8 (eight) hours. Qty: 12 tablet, Refills: 0      CONTINUE these medications which have CHANGED   Details  cloNIDine (CATAPRES) 0.1 MG tablet Take 2 tablets (0.2 mg total) by mouth 2 (two) times daily. Qty: 60 tablet, Refills: 0    ibuprofen (ADVIL,MOTRIN) 800 MG tablet Take 1 tablet (800 mg total) by mouth every 8 (eight) hours as needed. pain Qty: 30 tablet, Refills: 0      CONTINUE these medications which have NOT CHANGED   Details  QUEtiapine Fumarate (SEROQUEL XR) 150 MG 24 hr tablet Take 300 mg by mouth at bedtime.     traMADol (ULTRAM) 50 MG tablet Take 50 mg by mouth every 4 (four) hours as needed. pain           Consults:  Dr. Madilyn Fireman- Gastroenterology   SIGNIFICANT DIAGNOSTIC STUDIES:  Dg Abd 1 View  04/20/2011  *RADIOLOGY REPORT*  Clinical Data: Abdominal pain, recent colon surgery  ABDOMEN - 1 VIEW  Comparison: CT abdomen pelvis of 04/17/2011  Findings: A supine film of the abdomen shows surgical sutures in the right abdomen.  However, no bowel obstruction is seen.  No opaque calculi are noted.  Free intraperitoneal air cannot be evaluated on this supine film and if further assessment is warranted either an erect view of the abdomen or left lateral decubitus of the abdomen would be recommended.  IMPRESSION: No bowel obstruction.   Surgical sutures in the right abdomen. Cannot assess for free air on this supine film.  See above.  Original Report Authenticated By: Juline Patch, M.D.   Mr Abdomen W Wo Contrast  04/18/2011  *RADIOLOGY REPORT*  Clinical Data: Evaluate renal mass  MRI ABDOMEN WITH AND WITHOUT CONTRAST  Technique:  Multiplanar multisequence MR imaging of the abdomen was performed both before and after administration of intravenous contrast.  Contrast: 20mL MULTIHANCE GADOBENATE DIMEGLUMINE 529 MG/ML IV SOLN  Comparison: CT 04/17/2011  Findings:  Renal:  Lesion of concern in the left kidney measures 17 x 16 mm (image 53, series 1301).  This lesion is isointense on T2-weighted imaging and isointense on T1-weighted imaging .  There is no postcontrast enhancement of this lesion which is consistent with a Bosniak II, benign renal cysts.  There is a second smaller nonenhancing hemorrhagic cyst measuring 12 mm (image 73).  There is a Bosniak I cyst within the mid left kidney measuring 9 mm (image 63).  No focal hepatic lesion.  Within the right hepatic lobe there is a subcapsular triangular lesion which demonstrates early uniform arterial enhancement measuring 20 mm x 19 mm.  This lesion and is readily evident on the noncontrast pulse sequences.    This lesion in right hepatic lobe is isointense on the more delayed vascular sequences and therefore favor this to represent a vascular phenomenon such  as arterial portal shunting. There is a similar less well-defined lesion inferior right hepatic lobe (image 47, series 1301).  There is a nonenhancing simple hepatic cyst in the anterior medial left hepatic lobe measuring 10 mm (image 5, series 6).  The pancreas, spleen, adrenal glands, stomach, and limited view of the small bowel and colon appear normal.  No periportal retroperitoneal lymphadenopathy.  IMPRESSION:  1.  Bosniak II nonenhancing cyst within the left kidney corresponds to the lesion of concern on comparison CT.  This lesion  requires no specific follow-up. 2.  Additional Bosniak I and II cysts within the left kidney. 3.  Early arterial enhancing lesion in the right hepatic lobe which is only evident on one series is felt to most likely represent a benign vascular phenomenon.  Original Report Authenticated By: Genevive Bi, M.D.   Ct Abdomen Pelvis W Contrast  04/17/2011  *RADIOLOGY REPORT*  Clinical Data: Worsening abdominal pain and vomiting.  Nausea diaphoresis.  Hematuria.  Previous history of urolithiasis and diverticulitis.  CT ABDOMEN AND PELVIS WITH CONTRAST  Technique:  Multidetector CT imaging of the abdomen and pelvis was performed following the standard protocol during bolus administration of intravenous contrast.  Contrast:  100 ml Omnipaque-300 and oral contrast  Comparison: 11/10/2010 and 02/27/09  Findings: Postop changes seen from previous right colectomy. Moderate wall thickening and mild adjacent mesenteric inflammatory changes are seen involving the distal small bowel just proximal to the ileocolostomy anastomosis.  This is consistent with enteritis. No evidence of pneumatosis, abscess or free fluid.  No evidence of bowel obstruction.  The liver, spleen, pancreas, gallbladder, and both adrenal glands are normal in appearance.  No lymphadenopathy identified.  Bilateral renal parenchymal scarring noted as well as tiny nonobstructing calculus in the upper pole of the right kidney.  No evidence of hydronephrosis.  A 2 cm lesion is seen in the anterior mid pole of the left kidney which has increased in size since previous study.  This could represent a hemorrhagic cyst or solid renal mass.  IMPRESSION:  1.  Distal small bowel wall thickening in the right abdomen, just proximal to the ileocolonic anastomosis.  This is consistent with enteritis, and differential diagnosis includes Crohn's disease, infectious etiologies, or less likely ischemia. 2.  Increased size of 2 cm indeterminate lesion in the anterior mid pole  left kidney.  This could represent a hemorrhagic cyst or solid renal mass. Non-emergent abdomen MRI without with contrast is recommended for further characterization when the patient is able to comply with breath holding instructions.  Original Report Authenticated By: Danae Orleans, M.D.       Recent Results (from the past 240 hour(s))  URINE CULTURE     Status: Normal   Collection Time   04/18/11  3:01 AM      Component Value Range Status Comment   Specimen Description URINE, CLEAN CATCH   Final    Special Requests NONE   Final    Setup Time [id number]   Final    Colony Count NO GROWTH   Final    Culture NO GROWTH   Final    Report Status 04/19/2011 FINAL   Final   STOOL CULTURE     Status: Normal   Collection Time   04/18/11 10:59 AM      Component Value Range Status Comment   Specimen Description STOOL   Final    Special Requests NONE   Final    Culture     Final  Value: NO SALMONELLA, SHIGELLA, CAMPYLOBACTER, OR YERSINIA ISOLATED   Report Status 04/22/2011 FINAL   Final   CLOSTRIDIUM DIFFICILE BY PCR     Status: Normal   Collection Time   04/18/11 10:59 AM      Component Value Range Status Comment   C difficile by pcr NEGATIVE  NEGATIVE  Final   URINE CULTURE     Status: Normal   Collection Time   04/22/11 12:09 PM      Component Value Range Status Comment   Specimen Description URINE, CLEAN CATCH   Final    Special Requests PATIENT ON FOLLOWING CIPRO,FLAGYL   Final    Setup Time [id number]   Final    Colony Count NO GROWTH   Final    Culture NO GROWTH   Final    Report Status 04/23/2011 FINAL   Final     BRIEF ADMITTING H & P: 50 year-old male with history of hypertension and gout present to the ER at [facility] but complains of no pain over the last 2 days. Patient states the pain started in the lower part of the abdomen became more diffuse and started to have multiple episodes of nausea and vomiting and eventually patient came to the ER. In the ER  patient had a CAT scan of the abdomen and pelvis at this time shows features of enteritis at the ileocolonic anastomotic site. Patient states that 2 years ago he had surgery done at [facility] for colonic mass. He states the mass was noncancerous.Denies any chest pain or shortness of breath. Has had subjective feeling of chills but no fever.In addition patient is complaining of dysuria.   Hospital Course:  Present on Admission:   Abdominal Pain: The patient was admitted with abdominal pain and diarrhea. CT scan showed features consistent with terminal ileitis. The patient was seen by gastroenterology who recommended a colonoscopy for evaluation. The patient however has been under the care of Dr. Gaylyn Lambert a gastroenterologist in [facility] and prefers to followup with him for further evaluation.  The patient's diet was advanced to tolerance. The patient is tolerating his diet without any difficulty. The patient was also started empirically on ciprofloxacin and Flagyl and gastroenterology has recommended a course of 10 days.   Marland KitchenHTN (hypertension): The patient was on clonidine 0. 2 mg daily. The blood pressures are inadequately controlled.  While the patient is n.p.o. Started on clonidine patch was still had poor control. The dosage was clonidine has been increased to 0.2 mg by mouth twice a day. I discussed with this patient about changing her to another agent due to the rebound effect of clonidine after missing a dose of the patient at this time does not want to switch to another agent. The patient has a followup appointment with [facility] and will defer to them to further management medications for his hypertension at that time.   .Dysuria: The patient has some dysuria. A urinalysis was sent and was suggestive for UTI. However the urine culture showed no growth.  .ARF (acute renal failure): The patient's acute renal failure parietal sulci secondary to dehydration. This has resolved with  hydration.   .Diarrhea: The diarrhea has resolved.  Disposition and Follow-up:   The patient has a followup appointment with [facility] on 05/18/2011 for eligebility and for an office appointment on 05/26/2011. The patient is also to call Dr. Vernell Barrier in 2 days to schedule his appointment as a followup. Discharge Orders  Future Orders Please Complete By Expires   Diet - low sodium heart healthy      Activity as tolerated - No restrictions         DISCHARGE EXAM:  General: Alert, awake, oriented x3, in no acute distress. Blood pressure 147/92, pulse 75, temperature 98.9 F (37.2 C), temperature source Oral, resp. rate 20, height 6' (1.829 m), weight 94.756 kg (208 lb 14.4 oz), SpO2 99.00%. HEENT: /AT.Rt eye discolored/blind . Oropharynx moist no exudate erythema lesions are noted. Neck supple trachea is midline no masses no thyromegaly no JVD no carotid bruit Heart: Regular rate and rhythm, without murmurs, rubs, gallops.  Lungs: Clear to auscultation bilaterally.  Abdomen: soft, Less tender to palpation. (Primarily just in Rt lower quadrant today).  Extremities: Patient has some mild phlebitic changes on the right upper extremity at the area where the IV was placed.  Neuro: Grossly intact, nonfocal.    Basename 04/23/11 0600 04/22/11 0545  NA 138 138  K 3.8 3.7  CL 103 105  CO2 25 25  GLUCOSE 145* 94  BUN 10 7  CREATININE 1.19 1.18  CALCIUM 9.1 8.8  MG -- --  PHOS -- --   No results found for this basename: AST:2,ALT:2,ALKPHOS:2,BILITOT:2,PROT:2,ALBUMIN:2 in the last 72 hours No results found for this basename: LIPASE:2,AMYLASE:2 in the last 72 hours  Basename 04/23/11 0600 04/22/11 0545  WBC 8.7 7.3  NEUTROABS -- --  HGB 12.5* 11.9*  HCT 39.2 37.0*  MCV 78.4 77.7*  PLT 260 242    Signed: MATTHEWS,MICHELLE A. 04/23/2011, 11:11 AM

## 2011-06-04 ENCOUNTER — Emergency Department (HOSPITAL_BASED_OUTPATIENT_CLINIC_OR_DEPARTMENT_OTHER)
Admission: EM | Admit: 2011-06-04 | Discharge: 2011-06-04 | Disposition: A | Discharge status: Home / Self Care | Payer: Self-pay | Attending: Emergency Medicine | Admitting: Emergency Medicine

## 2011-06-04 ENCOUNTER — Emergency Department (INDEPENDENT_AMBULATORY_CARE_PROVIDER_SITE_OTHER): Payer: Self-pay

## 2011-06-04 ENCOUNTER — Encounter (HOSPITAL_BASED_OUTPATIENT_CLINIC_OR_DEPARTMENT_OTHER): Payer: Self-pay | Admitting: Emergency Medicine

## 2011-06-04 DIAGNOSIS — Z79899 Other long term (current) drug therapy: Secondary | ICD-10-CM | POA: Insufficient documentation

## 2011-06-04 DIAGNOSIS — H5440 Blindness, one eye, unspecified eye: Secondary | ICD-10-CM | POA: Insufficient documentation

## 2011-06-04 DIAGNOSIS — K5730 Diverticulosis of large intestine without perforation or abscess without bleeding: Secondary | ICD-10-CM | POA: Insufficient documentation

## 2011-06-04 DIAGNOSIS — Z87828 Personal history of other (healed) physical injury and trauma: Secondary | ICD-10-CM | POA: Insufficient documentation

## 2011-06-04 DIAGNOSIS — R300 Dysuria: Secondary | ICD-10-CM | POA: Insufficient documentation

## 2011-06-04 DIAGNOSIS — R109 Unspecified abdominal pain: Secondary | ICD-10-CM

## 2011-06-04 DIAGNOSIS — F3289 Other specified depressive episodes: Secondary | ICD-10-CM | POA: Insufficient documentation

## 2011-06-04 DIAGNOSIS — R1084 Generalized abdominal pain: Secondary | ICD-10-CM | POA: Insufficient documentation

## 2011-06-04 DIAGNOSIS — I10 Essential (primary) hypertension: Secondary | ICD-10-CM | POA: Insufficient documentation

## 2011-06-04 DIAGNOSIS — Z87442 Personal history of urinary calculi: Secondary | ICD-10-CM | POA: Insufficient documentation

## 2011-06-04 DIAGNOSIS — Z86718 Personal history of other venous thrombosis and embolism: Secondary | ICD-10-CM | POA: Insufficient documentation

## 2011-06-04 DIAGNOSIS — M109 Gout, unspecified: Secondary | ICD-10-CM | POA: Insufficient documentation

## 2011-06-04 DIAGNOSIS — R112 Nausea with vomiting, unspecified: Secondary | ICD-10-CM

## 2011-06-04 DIAGNOSIS — Z9889 Other specified postprocedural states: Secondary | ICD-10-CM

## 2011-06-04 DIAGNOSIS — F329 Major depressive disorder, single episode, unspecified: Secondary | ICD-10-CM | POA: Insufficient documentation

## 2011-06-04 DIAGNOSIS — G43909 Migraine, unspecified, not intractable, without status migrainosus: Secondary | ICD-10-CM | POA: Insufficient documentation

## 2011-06-04 HISTORY — DX: Diverticulosis of intestine, part unspecified, without perforation or abscess without bleeding: K57.90

## 2011-06-04 HISTORY — DX: Personal history of other (healed) physical injury and trauma: Z87.828

## 2011-06-04 LAB — LIPASE, BLOOD: Lipase: 36 U/L (ref 11–59)

## 2011-06-04 LAB — COMPREHENSIVE METABOLIC PANEL
ALT: 23 U/L (ref 0–53)
AST: 15 U/L (ref 0–37)
Albumin: 3.6 g/dL (ref 3.5–5.2)
CO2: 25 mEq/L (ref 19–32)
Calcium: 9.5 mg/dL (ref 8.4–10.5)
Creatinine, Ser: 1.3 mg/dL (ref 0.50–1.35)
GFR calc non Af Amer: 62 mL/min — ABNORMAL LOW (ref >90)
Sodium: 139 mEq/L (ref 135–145)
Total Protein: 7.6 g/dL (ref 6.0–8.3)

## 2011-06-04 LAB — CBC
HCT: 39.5 % (ref 39.0–52.0)
MCH: 25.2 pg — ABNORMAL LOW (ref 26.0–34.0)
MCHC: 33.7 g/dL (ref 30.0–36.0)
MCV: 75 fL — ABNORMAL LOW (ref 78.0–100.0)
Platelets: 253 10*3/uL (ref 150–400)
RDW: 15.8 % — ABNORMAL HIGH (ref 11.5–15.5)

## 2011-06-04 LAB — URINALYSIS, ROUTINE W REFLEX MICROSCOPIC
Nitrite: NEGATIVE
Protein, ur: NEGATIVE mg/dL
Specific Gravity, Urine: 1.017 (ref 1.005–1.030)
Urobilinogen, UA: 0.2 mg/dL (ref 0.0–1.0)

## 2011-06-04 LAB — URINE MICROSCOPIC-ADD ON

## 2011-06-04 LAB — DIFFERENTIAL
Basophils Absolute: 0 10*3/uL (ref 0.0–0.1)
Basophils Relative: 0 % (ref 0–1)
Eosinophils Absolute: 0.2 10*3/uL (ref 0.0–0.7)
Eosinophils Relative: 3 % (ref 0–5)
Monocytes Absolute: 0.4 10*3/uL (ref 0.1–1.0)

## 2011-06-04 MED ORDER — HYDROCODONE-ACETAMINOPHEN 5-500 MG PO TABS: 1.0000 | ORAL_TABLET | Freq: Four times a day (QID) | ORAL | Status: AC | PRN

## 2011-06-04 MED ORDER — ONDANSETRON HCL 4 MG PO TABS: 4.0000 mg | ORAL_TABLET | Freq: Three times a day (TID) | ORAL | Status: AC | PRN

## 2011-06-04 MED ORDER — SODIUM CHLORIDE 0.9 % IV SOLN: 999.0000 mL | INTRAVENOUS | Status: DC

## 2011-06-04 MED ORDER — ONDANSETRON HCL 4 MG/2ML IJ SOLN
4.0000 mg | Freq: Once | INTRAMUSCULAR | Status: AC
  Administered 2011-06-04: 4 mg via INTRAVENOUS
  Filled 2011-06-04: qty 2

## 2011-06-04 MED ORDER — IOHEXOL 300 MG/ML  SOLN
20.0000 mL | INTRAMUSCULAR | Status: AC
  Administered 2011-06-04: 20 mL via ORAL

## 2011-06-04 MED ORDER — HYDROMORPHONE HCL PF 1 MG/ML IJ SOLN
1.0000 mg | Freq: Once | INTRAMUSCULAR | Status: AC
  Administered 2011-06-04: 1 mg via INTRAVENOUS
  Filled 2011-06-04: qty 1

## 2011-06-04 MED ORDER — PROMETHAZINE HCL 25 MG/ML IJ SOLN
25.0000 mg | Freq: Once | INTRAMUSCULAR | Status: AC
  Administered 2011-06-04: 25 mg via INTRAVENOUS
  Filled 2011-06-04: qty 1

## 2011-06-04 MED ORDER — MORPHINE SULFATE 4 MG/ML IJ SOLN
4.0000 mg | Freq: Once | INTRAMUSCULAR | Status: AC
  Administered 2011-06-04: 4 mg via INTRAVENOUS
  Filled 2011-06-04: qty 1

## 2011-06-04 MED ORDER — IOHEXOL 300 MG/ML  SOLN
100.0000 mL | Freq: Once | INTRAMUSCULAR | Status: AC | PRN
  Administered 2011-06-04: 100 mL via INTRAVENOUS

## 2011-06-04 NOTE — ED Notes (Signed)
Pt states he is having generalized abdominal pain.  Some N/V, no D.  No known fever.  Pt states he's to have a colonoscopy this week.
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
## 2011-06-05 NOTE — ED Provider Notes (Signed)
I saw and evaluated the patient, reviewed the resident's note and I agree with the findings and plan.   .Face to face Exam:  General:  Awake HEENT:  Atraumatic Resp:  Normal effort Abd:  Nondistended Neuro:No focal weakness Lymph: No adenopathy   Nelia Shi, MD 06/05/11 1028

## 2011-08-22 ENCOUNTER — Encounter (HOSPITAL_BASED_OUTPATIENT_CLINIC_OR_DEPARTMENT_OTHER): Payer: Self-pay | Admitting: *Deleted

## 2011-08-22 ENCOUNTER — Emergency Department (INDEPENDENT_AMBULATORY_CARE_PROVIDER_SITE_OTHER): Payer: Self-pay

## 2011-08-22 ENCOUNTER — Emergency Department (HOSPITAL_BASED_OUTPATIENT_CLINIC_OR_DEPARTMENT_OTHER)
Admission: EM | Admit: 2011-08-22 | Discharge: 2011-08-23 | Disposition: A | Discharge status: Other Acute Inpatient Hospital | Payer: Self-pay | Attending: Emergency Medicine | Admitting: Emergency Medicine

## 2011-08-22 DIAGNOSIS — N289 Disorder of kidney and ureter, unspecified: Secondary | ICD-10-CM | POA: Insufficient documentation

## 2011-08-22 DIAGNOSIS — H5440 Blindness, one eye, unspecified eye: Secondary | ICD-10-CM | POA: Insufficient documentation

## 2011-08-22 DIAGNOSIS — F329 Major depressive disorder, single episode, unspecified: Secondary | ICD-10-CM | POA: Insufficient documentation

## 2011-08-22 DIAGNOSIS — I10 Essential (primary) hypertension: Secondary | ICD-10-CM | POA: Insufficient documentation

## 2011-08-22 DIAGNOSIS — H44529 Atrophy of globe, unspecified eye: Secondary | ICD-10-CM

## 2011-08-22 DIAGNOSIS — G8929 Other chronic pain: Secondary | ICD-10-CM | POA: Insufficient documentation

## 2011-08-22 DIAGNOSIS — R030 Elevated blood-pressure reading, without diagnosis of hypertension: Secondary | ICD-10-CM

## 2011-08-22 DIAGNOSIS — R112 Nausea with vomiting, unspecified: Secondary | ICD-10-CM | POA: Insufficient documentation

## 2011-08-22 DIAGNOSIS — Z8639 Personal history of other endocrine, nutritional and metabolic disease: Secondary | ICD-10-CM | POA: Insufficient documentation

## 2011-08-22 DIAGNOSIS — H538 Other visual disturbances: Secondary | ICD-10-CM | POA: Insufficient documentation

## 2011-08-22 DIAGNOSIS — R109 Unspecified abdominal pain: Secondary | ICD-10-CM | POA: Insufficient documentation

## 2011-08-22 DIAGNOSIS — F3289 Other specified depressive episodes: Secondary | ICD-10-CM | POA: Insufficient documentation

## 2011-08-22 DIAGNOSIS — H53149 Visual discomfort, unspecified: Secondary | ICD-10-CM | POA: Insufficient documentation

## 2011-08-22 DIAGNOSIS — Z79899 Other long term (current) drug therapy: Secondary | ICD-10-CM | POA: Insufficient documentation

## 2011-08-22 DIAGNOSIS — R10819 Abdominal tenderness, unspecified site: Secondary | ICD-10-CM | POA: Insufficient documentation

## 2011-08-22 DIAGNOSIS — Z87442 Personal history of urinary calculi: Secondary | ICD-10-CM | POA: Insufficient documentation

## 2011-08-22 DIAGNOSIS — R51 Headache: Secondary | ICD-10-CM | POA: Insufficient documentation

## 2011-08-22 DIAGNOSIS — Z862 Personal history of diseases of the blood and blood-forming organs and certain disorders involving the immune mechanism: Secondary | ICD-10-CM | POA: Insufficient documentation

## 2011-08-22 DIAGNOSIS — R42 Dizziness and giddiness: Secondary | ICD-10-CM

## 2011-08-22 DIAGNOSIS — Z86718 Personal history of other venous thrombosis and embolism: Secondary | ICD-10-CM | POA: Insufficient documentation

## 2011-08-22 DIAGNOSIS — I169 Hypertensive crisis, unspecified: Secondary | ICD-10-CM

## 2011-08-22 DIAGNOSIS — R9431 Abnormal electrocardiogram [ECG] [EKG]: Secondary | ICD-10-CM | POA: Insufficient documentation

## 2011-08-22 LAB — DIFFERENTIAL
Basophils Absolute: 0 10*3/uL (ref 0.0–0.1)
Lymphocytes Relative: 28 % (ref 12–46)
Lymphs Abs: 1.8 10*3/uL (ref 0.7–4.0)
Monocytes Absolute: 0.6 10*3/uL (ref 0.1–1.0)
Neutro Abs: 4 10*3/uL (ref 1.7–7.7)

## 2011-08-22 LAB — CSF CELL COUNT WITH DIFFERENTIAL
Tube #: 1
Tube #: 4

## 2011-08-22 LAB — BASIC METABOLIC PANEL
CO2: 26 mEq/L (ref 19–32)
Chloride: 105 mEq/L (ref 96–112)
Creatinine, Ser: 1.5 mg/dL — ABNORMAL HIGH (ref 0.50–1.35)
Glucose, Bld: 108 mg/dL — ABNORMAL HIGH (ref 70–99)
Sodium: 141 mEq/L (ref 135–145)

## 2011-08-22 LAB — CBC
HCT: 41.2 % (ref 39.0–52.0)
RBC: 5.38 MIL/uL (ref 4.22–5.81)
RDW: 15.2 % (ref 11.5–15.5)
WBC: 6.5 10*3/uL (ref 4.0–10.5)

## 2011-08-22 LAB — TROPONIN I: Troponin I: <0.3 ng/mL (ref <0.30)

## 2011-08-22 MED ORDER — FENTANYL CITRATE 0.05 MG/ML IJ SOLN
100.0000 ug | Freq: Once | INTRAMUSCULAR | Status: AC
  Administered 2011-08-22: 100 ug via INTRAVENOUS
  Filled 2011-08-22: qty 2

## 2011-08-22 MED ORDER — LABETALOL HCL 5 MG/ML IV SOLN
10.0000 mg | Freq: Once | INTRAVENOUS | Status: AC
  Administered 2011-08-22: 10 mg via INTRAVENOUS
  Filled 2011-08-22: qty 4

## 2011-08-22 MED ORDER — HYDROMORPHONE HCL PF 1 MG/ML IJ SOLN
1.0000 mg | Freq: Once | INTRAMUSCULAR | Status: AC
  Administered 2011-08-22: 1 mg via INTRAVENOUS

## 2011-08-22 MED ORDER — DEXAMETHASONE SODIUM PHOSPHATE 10 MG/ML IJ SOLN
10.0000 mg | Freq: Once | INTRAMUSCULAR | Status: AC
  Administered 2011-08-22: 10 mg via INTRAVENOUS
  Filled 2011-08-22: qty 1

## 2011-08-22 MED ORDER — HYDROMORPHONE HCL PF 1 MG/ML IJ SOLN
INTRAMUSCULAR | Status: AC
  Administered 2011-08-22: 1 mg via INTRAVENOUS
  Filled 2011-08-22: qty 1

## 2011-08-22 MED ORDER — DIPHENHYDRAMINE HCL 50 MG/ML IJ SOLN
25.0000 mg | Freq: Once | INTRAMUSCULAR | Status: AC
  Administered 2011-08-22: 25 mg via INTRAVENOUS
  Filled 2011-08-22: qty 1

## 2011-08-22 MED ORDER — LABETALOL HCL 5 MG/ML IV SOLN
20.0000 mg | Freq: Once | INTRAVENOUS | Status: AC
  Administered 2011-08-22: 20 mg via INTRAVENOUS
  Filled 2011-08-22: qty 4

## 2011-08-22 MED ORDER — CLONIDINE HCL 0.1 MG PO TABS
0.3000 mg | ORAL_TABLET | Freq: Once | ORAL | Status: AC
  Administered 2011-08-22: 0.3 mg via ORAL
  Filled 2011-08-22: qty 3

## 2011-08-22 MED ORDER — LIDOCAINE HCL (PF) 1 % IJ SOLN
INTRAMUSCULAR | Status: AC
  Filled 2011-08-22: qty 5

## 2011-08-22 MED ORDER — METOCLOPRAMIDE HCL 5 MG/ML IJ SOLN
10.0000 mg | Freq: Once | INTRAMUSCULAR | Status: AC
  Administered 2011-08-22: 10 mg via INTRAVENOUS
  Filled 2011-08-22: qty 2

## 2011-08-22 NOTE — ED Notes (Signed)
Patient reports headache still "off the charts"; vital signs assessed; RN aware.
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
## 2011-08-22 NOTE — ED Notes (Signed)
Pt remains laying flat, headache has decreased in intensity per patient, comfortable, no needs at this time

## 2011-08-22 NOTE — ED Notes (Signed)
Headache since 3am. Vomited x 3. Light sensative. Blurred vision. Hx of migraine headaches.

## 2011-08-22 NOTE — ED Notes (Signed)
MD at bedside. 

## 2011-08-22 NOTE — ED Notes (Signed)
Lumbar puncture completed, pt tolerated well, laying flat x , pt denies numbness, tingling, no signs of csf noted, bp stable, will cont. To monitor

## 2011-08-23 NOTE — ED Notes (Signed)
Pt transported to [facility] via [facility], pt stated he would notify his wife in am.

## 2011-08-26 LAB — CSF CULTURE W GRAM STAIN: Culture: NO GROWTH

## 2011-08-28 ENCOUNTER — Encounter (HOSPITAL_BASED_OUTPATIENT_CLINIC_OR_DEPARTMENT_OTHER): Payer: Self-pay

## 2011-08-28 ENCOUNTER — Emergency Department (HOSPITAL_BASED_OUTPATIENT_CLINIC_OR_DEPARTMENT_OTHER)
Admission: EM | Admit: 2011-08-28 | Discharge: 2011-08-29 | Disposition: A | Discharge status: Home / Self Care | Payer: Self-pay | Attending: Emergency Medicine | Admitting: Emergency Medicine

## 2011-08-28 DIAGNOSIS — R51 Headache: Secondary | ICD-10-CM | POA: Insufficient documentation

## 2011-08-28 DIAGNOSIS — Z79899 Other long term (current) drug therapy: Secondary | ICD-10-CM | POA: Insufficient documentation

## 2011-08-28 DIAGNOSIS — I10 Essential (primary) hypertension: Secondary | ICD-10-CM | POA: Insufficient documentation

## 2011-08-28 MED ORDER — SODIUM CHLORIDE 0.9 % IV BOLUS (SEPSIS)
1000.0000 mL | Freq: Once | INTRAVENOUS | Status: AC
  Administered 2011-08-28: 1000 mL via INTRAVENOUS

## 2011-08-28 MED ORDER — DEXAMETHASONE SODIUM PHOSPHATE 10 MG/ML IJ SOLN
10.0000 mg | Freq: Once | INTRAMUSCULAR | Status: AC
  Administered 2011-08-28: 10 mg via INTRAVENOUS
  Filled 2011-08-28 (×2): qty 1

## 2011-08-28 MED ORDER — DIPHENHYDRAMINE HCL 50 MG/ML IJ SOLN
25.0000 mg | Freq: Once | INTRAMUSCULAR | Status: AC
  Administered 2011-08-28: 25 mg via INTRAVENOUS
  Filled 2011-08-28: qty 1

## 2011-08-28 MED ORDER — KETOROLAC TROMETHAMINE 30 MG/ML IJ SOLN
30.0000 mg | Freq: Once | INTRAMUSCULAR | Status: AC
  Administered 2011-08-28: 30 mg via INTRAVENOUS
  Filled 2011-08-28: qty 1

## 2011-08-28 MED ORDER — METOCLOPRAMIDE HCL 5 MG/ML IJ SOLN
10.0000 mg | Freq: Once | INTRAMUSCULAR | Status: AC
  Administered 2011-08-28: 10 mg via INTRAVENOUS
  Filled 2011-08-28: qty 2

## 2011-08-28 NOTE — Discharge Instructions (Signed)
Take your vicodin as needed for severe pain.  Take ibuprofen or advil 3 times a day with food as well.  Try to stay ahead of the pain.  Follow up with your doctor as scheduled on Tuesday.  You should return to the ER if your headache worsens or you develop associated fever.
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
## 2011-08-28 NOTE — ED Notes (Signed)
In speaking more with pts wife, she reports several times of "not understanding what hes saying"  I clarified with her that the speech is clear but inappropriate to surroundings and situation.  Pt states that HA is constant and "nagging"  Pt further reports that HA has steadily gotten worse over today.

## 2011-08-28 NOTE — ED Notes (Signed)
Pt presents without indwelling IV catheter.  Not charted as removed from previous encounter

## 2011-08-28 NOTE — ED Notes (Signed)
Pt states that he has had a severe headache for the past several days, back of neck is very sensitive to touch, reports blurry vision.  Denies vomiting, fever.  C/o nausea.

## 2011-08-29 NOTE — ED Provider Notes (Signed)
Medical screening examination/treatment/procedure(s) were performed by non-physician practitioner and as supervising physician I was immediately available for consultation/collaboration.   Holmes Hays Y. Vanetta Rule, MD 08/29/11 1522 

## 2011-10-26 ENCOUNTER — Encounter (HOSPITAL_BASED_OUTPATIENT_CLINIC_OR_DEPARTMENT_OTHER): Payer: Self-pay | Admitting: Family Medicine

## 2011-10-26 ENCOUNTER — Emergency Department (HOSPITAL_BASED_OUTPATIENT_CLINIC_OR_DEPARTMENT_OTHER)
Admission: EM | Admit: 2011-10-26 | Discharge: 2011-10-26 | Disposition: A | Discharge status: Home / Self Care | Payer: Self-pay | Attending: Emergency Medicine | Admitting: Emergency Medicine

## 2011-10-26 DIAGNOSIS — M109 Gout, unspecified: Secondary | ICD-10-CM | POA: Insufficient documentation

## 2011-10-26 DIAGNOSIS — Z86718 Personal history of other venous thrombosis and embolism: Secondary | ICD-10-CM | POA: Insufficient documentation

## 2011-10-26 DIAGNOSIS — R51 Headache: Secondary | ICD-10-CM | POA: Insufficient documentation

## 2011-10-26 MED ORDER — DIPHENHYDRAMINE HCL 50 MG/ML IJ SOLN
25.0000 mg | Freq: Once | INTRAMUSCULAR | Status: AC
  Administered 2011-10-26: 25 mg via INTRAMUSCULAR
  Filled 2011-10-26: qty 1

## 2011-10-26 MED ORDER — METOCLOPRAMIDE HCL 5 MG/ML IJ SOLN
10.0000 mg | Freq: Once | INTRAMUSCULAR | Status: AC
  Administered 2011-10-26: 10 mg via INTRAMUSCULAR
  Filled 2011-10-26: qty 2

## 2011-10-26 MED ORDER — KETOROLAC TROMETHAMINE 30 MG/ML IJ SOLN
60.0000 mg | Freq: Once | INTRAMUSCULAR | Status: AC
  Administered 2011-10-26: 30 mg via INTRAMUSCULAR
  Filled 2011-10-26: qty 1
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
## 2011-10-26 NOTE — ED Provider Notes (Signed)
Medical screening examination/treatment/procedure(s) were performed by non-physician practitioner and as supervising physician I was immediately available for consultation/collaboration.   Charles B. Bernette Mayers, MD 10/26/11 [phone]

## 2011-10-26 NOTE — ED Notes (Addendum)
Pt c/o throbbing headache since 4pm yesterday. Pt sts he took ibuprofen and toradol this morning without relief. Pt took Vicodin last night.  Pt also c/o n/v and photophobia. Pt reports h/o similar headaches.

## 2011-10-30 ENCOUNTER — Emergency Department (HOSPITAL_BASED_OUTPATIENT_CLINIC_OR_DEPARTMENT_OTHER)
Admission: EM | Admit: 2011-10-30 | Discharge: 2011-10-30 | Disposition: A | Discharge status: Home / Self Care | Payer: Self-pay | Attending: Emergency Medicine | Admitting: Emergency Medicine

## 2011-10-30 ENCOUNTER — Encounter (HOSPITAL_BASED_OUTPATIENT_CLINIC_OR_DEPARTMENT_OTHER): Payer: Self-pay | Admitting: Emergency Medicine

## 2011-10-30 ENCOUNTER — Emergency Department (HOSPITAL_BASED_OUTPATIENT_CLINIC_OR_DEPARTMENT_OTHER): Payer: Self-pay

## 2011-10-30 DIAGNOSIS — M545 Low back pain, unspecified: Secondary | ICD-10-CM | POA: Insufficient documentation

## 2011-10-30 DIAGNOSIS — R109 Unspecified abdominal pain: Secondary | ICD-10-CM | POA: Insufficient documentation

## 2011-10-30 DIAGNOSIS — Z86718 Personal history of other venous thrombosis and embolism: Secondary | ICD-10-CM | POA: Insufficient documentation

## 2011-10-30 DIAGNOSIS — Z79899 Other long term (current) drug therapy: Secondary | ICD-10-CM | POA: Insufficient documentation

## 2011-10-30 DIAGNOSIS — F3289 Other specified depressive episodes: Secondary | ICD-10-CM | POA: Insufficient documentation

## 2011-10-30 DIAGNOSIS — I10 Essential (primary) hypertension: Secondary | ICD-10-CM | POA: Insufficient documentation

## 2011-10-30 DIAGNOSIS — Z8639 Personal history of other endocrine, nutritional and metabolic disease: Secondary | ICD-10-CM | POA: Insufficient documentation

## 2011-10-30 DIAGNOSIS — R300 Dysuria: Secondary | ICD-10-CM | POA: Insufficient documentation

## 2011-10-30 DIAGNOSIS — H5440 Blindness, one eye, unspecified eye: Secondary | ICD-10-CM | POA: Insufficient documentation

## 2011-10-30 DIAGNOSIS — R319 Hematuria, unspecified: Secondary | ICD-10-CM | POA: Insufficient documentation

## 2011-10-30 DIAGNOSIS — M549 Dorsalgia, unspecified: Secondary | ICD-10-CM

## 2011-10-30 DIAGNOSIS — Z862 Personal history of diseases of the blood and blood-forming organs and certain disorders involving the immune mechanism: Secondary | ICD-10-CM | POA: Insufficient documentation

## 2011-10-30 DIAGNOSIS — F329 Major depressive disorder, single episode, unspecified: Secondary | ICD-10-CM | POA: Insufficient documentation

## 2011-10-30 DIAGNOSIS — R110 Nausea: Secondary | ICD-10-CM | POA: Insufficient documentation

## 2011-10-30 LAB — URINALYSIS, ROUTINE W REFLEX MICROSCOPIC
Bilirubin Urine: NEGATIVE
Glucose, UA: NEGATIVE mg/dL
Ketones, ur: NEGATIVE mg/dL
Protein, ur: NEGATIVE mg/dL
pH: 6 (ref 5.0–8.0)

## 2011-10-30 LAB — BASIC METABOLIC PANEL
BUN: 18 mg/dL (ref 6–23)
CO2: 28 mEq/L (ref 19–32)
Calcium: 9.2 mg/dL (ref 8.4–10.5)
Chloride: 104 mEq/L (ref 96–112)
Creatinine, Ser: 1.2 mg/dL (ref 0.50–1.35)
Glucose, Bld: 97 mg/dL (ref 70–99)

## 2011-10-30 LAB — URINE MICROSCOPIC-ADD ON

## 2011-10-30 MED ORDER — KETOROLAC TROMETHAMINE 30 MG/ML IJ SOLN
30.0000 mg | Freq: Once | INTRAMUSCULAR | Status: AC
  Administered 2011-10-30: 30 mg via INTRAVENOUS
  Filled 2011-10-30: qty 1

## 2011-10-30 MED ORDER — IBUPROFEN 800 MG PO TABS: 800.0000 mg | ORAL_TABLET | Freq: Three times a day (TID) | ORAL | Status: AC

## 2011-10-30 MED ORDER — DIAZEPAM 5 MG/ML IJ SOLN
5.0000 mg | Freq: Once | INTRAMUSCULAR | Status: AC
  Administered 2011-10-30: 5 mg via INTRAVENOUS
  Filled 2011-10-30: qty 2

## 2011-10-30 MED ORDER — MORPHINE SULFATE 4 MG/ML IJ SOLN
4.0000 mg | Freq: Once | INTRAMUSCULAR | Status: AC
  Administered 2011-10-30: 4 mg via INTRAVENOUS
  Filled 2011-10-30: qty 1

## 2011-10-30 MED ORDER — SODIUM CHLORIDE 0.9 % IV BOLUS (SEPSIS)
1000.0000 mL | Freq: Once | INTRAVENOUS | Status: AC
  Administered 2011-10-30: 1000 mL via INTRAVENOUS

## 2011-10-30 MED ORDER — ONDANSETRON HCL 4 MG/2ML IJ SOLN
4.0000 mg | Freq: Once | INTRAMUSCULAR | Status: AC
  Administered 2011-10-30: 4 mg via INTRAVENOUS
  Filled 2011-10-30: qty 2

## 2011-10-30 MED ORDER — DIAZEPAM 5 MG PO TABS: 5.0000 mg | ORAL_TABLET | Freq: Two times a day (BID) | ORAL | Status: AC

## 2011-10-30 MED ORDER — HYDROCODONE-ACETAMINOPHEN 5-325 MG PO TABS: 2.0000 | ORAL_TABLET | ORAL | Status: AC | PRN

## 2011-10-30 NOTE — ED Notes (Signed)
Pt c/o LT flank pain since yesterday- hx of kidney stones
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
## 2011-11-27 ENCOUNTER — Emergency Department (HOSPITAL_BASED_OUTPATIENT_CLINIC_OR_DEPARTMENT_OTHER)
Admission: EM | Admit: 2011-11-27 | Discharge: 2011-11-27 | Disposition: A | Discharge status: Home / Self Care | Payer: Self-pay | Attending: Emergency Medicine | Admitting: Emergency Medicine

## 2011-11-27 ENCOUNTER — Encounter (HOSPITAL_BASED_OUTPATIENT_CLINIC_OR_DEPARTMENT_OTHER): Payer: Self-pay | Admitting: *Deleted

## 2011-11-27 DIAGNOSIS — F3289 Other specified depressive episodes: Secondary | ICD-10-CM | POA: Insufficient documentation

## 2011-11-27 DIAGNOSIS — Z87442 Personal history of urinary calculi: Secondary | ICD-10-CM | POA: Insufficient documentation

## 2011-11-27 DIAGNOSIS — I10 Essential (primary) hypertension: Secondary | ICD-10-CM | POA: Insufficient documentation

## 2011-11-27 DIAGNOSIS — F329 Major depressive disorder, single episode, unspecified: Secondary | ICD-10-CM | POA: Insufficient documentation

## 2011-11-27 DIAGNOSIS — R51 Headache: Secondary | ICD-10-CM | POA: Insufficient documentation

## 2011-11-27 DIAGNOSIS — Z86718 Personal history of other venous thrombosis and embolism: Secondary | ICD-10-CM | POA: Insufficient documentation

## 2011-11-27 DIAGNOSIS — M109 Gout, unspecified: Secondary | ICD-10-CM | POA: Insufficient documentation

## 2011-11-27 MED ORDER — DIPHENHYDRAMINE HCL 50 MG/ML IJ SOLN
25.0000 mg | Freq: Once | INTRAMUSCULAR | Status: AC
  Administered 2011-11-27: 12.5 mg via INTRAVENOUS
  Filled 2011-11-27: qty 1

## 2011-11-27 MED ORDER — KETOROLAC TROMETHAMINE 30 MG/ML IJ SOLN
30.0000 mg | Freq: Once | INTRAMUSCULAR | Status: AC
  Administered 2011-11-27: 30 mg via INTRAVENOUS
  Filled 2011-11-27: qty 1

## 2011-11-27 MED ORDER — HYDROCODONE-ACETAMINOPHEN 5-500 MG PO TABS: 1.0000 | ORAL_TABLET | Freq: Four times a day (QID) | ORAL | Status: AC | PRN

## 2011-11-27 MED ORDER — MORPHINE SULFATE 4 MG/ML IJ SOLN
4.0000 mg | Freq: Once | INTRAMUSCULAR | Status: AC
  Administered 2011-11-27: 4 mg via INTRAVENOUS

## 2011-11-27 MED ORDER — METOCLOPRAMIDE HCL 5 MG/ML IJ SOLN
10.0000 mg | Freq: Once | INTRAMUSCULAR | Status: AC
  Administered 2011-11-27: 10 mg via INTRAVENOUS
  Filled 2011-11-27: qty 2

## 2011-11-27 MED ORDER — SODIUM CHLORIDE 0.9 % IV BOLUS (SEPSIS)
500.0000 mL | Freq: Once | INTRAVENOUS | Status: AC
  Administered 2011-11-27: 1000 mL via INTRAVENOUS

## 2011-11-27 MED ORDER — MORPHINE SULFATE 4 MG/ML IJ SOLN
INTRAMUSCULAR | Status: AC
  Administered 2011-11-27: 4 mg via INTRAVENOUS
  Filled 2011-11-27: qty 1

## 2011-11-27 NOTE — ED Notes (Signed)
Patient c/o HA since yesterday took toradol this AM, no relief, took 800 mg ibuprofen at 630am no relief, BP elevated since Friday
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
## 2012-01-01 ENCOUNTER — Emergency Department (HOSPITAL_BASED_OUTPATIENT_CLINIC_OR_DEPARTMENT_OTHER): Payer: Self-pay

## 2012-01-01 ENCOUNTER — Encounter (HOSPITAL_BASED_OUTPATIENT_CLINIC_OR_DEPARTMENT_OTHER): Payer: Self-pay | Admitting: Emergency Medicine

## 2012-01-01 ENCOUNTER — Emergency Department (HOSPITAL_BASED_OUTPATIENT_CLINIC_OR_DEPARTMENT_OTHER)
Admission: EM | Admit: 2012-01-01 | Discharge: 2012-01-01 | Disposition: A | Discharge status: Home / Self Care | Payer: Self-pay | Attending: Emergency Medicine | Admitting: Emergency Medicine

## 2012-01-01 DIAGNOSIS — I10 Essential (primary) hypertension: Secondary | ICD-10-CM | POA: Insufficient documentation

## 2012-01-01 DIAGNOSIS — F329 Major depressive disorder, single episode, unspecified: Secondary | ICD-10-CM | POA: Insufficient documentation

## 2012-01-01 DIAGNOSIS — M109 Gout, unspecified: Secondary | ICD-10-CM | POA: Insufficient documentation

## 2012-01-01 DIAGNOSIS — K5732 Diverticulitis of large intestine without perforation or abscess without bleeding: Secondary | ICD-10-CM | POA: Insufficient documentation

## 2012-01-01 DIAGNOSIS — F3289 Other specified depressive episodes: Secondary | ICD-10-CM | POA: Insufficient documentation

## 2012-01-01 DIAGNOSIS — Z86718 Personal history of other venous thrombosis and embolism: Secondary | ICD-10-CM | POA: Insufficient documentation

## 2012-01-01 DIAGNOSIS — K5792 Diverticulitis of intestine, part unspecified, without perforation or abscess without bleeding: Secondary | ICD-10-CM

## 2012-01-01 DIAGNOSIS — H5440 Blindness, one eye, unspecified eye: Secondary | ICD-10-CM | POA: Insufficient documentation

## 2012-01-01 DIAGNOSIS — Z79899 Other long term (current) drug therapy: Secondary | ICD-10-CM | POA: Insufficient documentation

## 2012-01-01 LAB — COMPREHENSIVE METABOLIC PANEL
Albumin: 3.8 g/dL (ref 3.5–5.2)
BUN: 12 mg/dL (ref 6–23)
CO2: 26 mEq/L (ref 19–32)
Calcium: 9.6 mg/dL (ref 8.4–10.5)
Chloride: 102 mEq/L (ref 96–112)
Creatinine, Ser: 1.4 mg/dL — ABNORMAL HIGH (ref 0.50–1.35)
GFR calc non Af Amer: 56 mL/min — ABNORMAL LOW (ref >90)
Total Bilirubin: 0.7 mg/dL (ref 0.3–1.2)

## 2012-01-01 LAB — CBC WITH DIFFERENTIAL/PLATELET
Basophils Relative: 0 % (ref 0–1)
Eosinophils Relative: 2 % (ref 0–5)
HCT: 42.5 % (ref 39.0–52.0)
Hemoglobin: 14.3 g/dL (ref 13.0–17.0)
MCHC: 33.6 g/dL (ref 30.0–36.0)
MCV: 75.5 fL — ABNORMAL LOW (ref 78.0–100.0)
Monocytes Absolute: 0.6 10*3/uL (ref 0.1–1.0)
Monocytes Relative: 7 % (ref 3–12)
Neutro Abs: 5.5 10*3/uL (ref 1.7–7.7)
RDW: 15.2 % (ref 11.5–15.5)

## 2012-01-01 LAB — URINALYSIS, ROUTINE W REFLEX MICROSCOPIC
Ketones, ur: NEGATIVE mg/dL
Leukocytes, UA: NEGATIVE
Nitrite: NEGATIVE
Protein, ur: NEGATIVE mg/dL

## 2012-01-01 LAB — RAPID URINE DRUG SCREEN, HOSP PERFORMED
Amphetamines: NOT DETECTED
Benzodiazepines: NOT DETECTED
Cocaine: NOT DETECTED
Opiates: POSITIVE — AB
Tetrahydrocannabinol: NOT DETECTED

## 2012-01-01 LAB — LIPASE, BLOOD: Lipase: 20 U/L (ref 11–59)

## 2012-01-01 LAB — LACTIC ACID, PLASMA: Lactic Acid, Venous: 1.3 mmol/L (ref 0.5–2.2)

## 2012-01-01 MED ORDER — CIPROFLOXACIN HCL 500 MG PO TABS
500.0000 mg | ORAL_TABLET | Freq: Once | ORAL | Status: AC
  Administered 2012-01-01: 500 mg via ORAL
  Filled 2012-01-01: qty 1

## 2012-01-01 MED ORDER — LABETALOL HCL 5 MG/ML IV SOLN
10.0000 mg | Freq: Once | INTRAVENOUS | Status: AC
  Administered 2012-01-01: 10 mg via INTRAVENOUS
  Filled 2012-01-01: qty 4

## 2012-01-01 MED ORDER — METOPROLOL TARTRATE 25 MG PO TABS: 25.0000 mg | ORAL_TABLET | Freq: Two times a day (BID) | ORAL | Status: DC

## 2012-01-01 MED ORDER — METRONIDAZOLE 500 MG PO TABS: 500.0000 mg | ORAL_TABLET | Freq: Three times a day (TID) | ORAL | Status: AC

## 2012-01-01 MED ORDER — MORPHINE SULFATE 4 MG/ML IJ SOLN
4.0000 mg | Freq: Once | INTRAMUSCULAR | Status: AC
  Administered 2012-01-01: 4 mg via INTRAVENOUS
  Filled 2012-01-01: qty 1

## 2012-01-01 MED ORDER — ONDANSETRON HCL 4 MG/2ML IJ SOLN
4.0000 mg | Freq: Once | INTRAMUSCULAR | Status: AC
  Administered 2012-01-01: 4 mg via INTRAVENOUS
  Filled 2012-01-01: qty 2

## 2012-01-01 MED ORDER — CLONIDINE HCL 0.1 MG PO TABS
0.2000 mg | ORAL_TABLET | Freq: Once | ORAL | Status: AC
  Administered 2012-01-01: 0.2 mg via ORAL
  Filled 2012-01-01: qty 2

## 2012-01-01 MED ORDER — METRONIDAZOLE 500 MG PO TABS
500.0000 mg | ORAL_TABLET | Freq: Once | ORAL | Status: AC
  Administered 2012-01-01: 500 mg via ORAL
  Filled 2012-01-01: qty 1

## 2012-01-01 MED ORDER — CLONIDINE HCL 0.2 MG PO TABS: 0.3000 mg | ORAL_TABLET | Freq: Two times a day (BID) | ORAL | Status: DC

## 2012-01-01 MED ORDER — CIPROFLOXACIN HCL 500 MG PO TABS: 500.0000 mg | ORAL_TABLET | Freq: Two times a day (BID) | ORAL | Status: AC

## 2012-01-01 MED ORDER — SODIUM CHLORIDE 0.9 % IV BOLUS (SEPSIS)
1000.0000 mL | Freq: Once | INTRAVENOUS | Status: AC
  Administered 2012-01-01: 1000 mL via INTRAVENOUS

## 2012-01-01 MED ORDER — HYDROMORPHONE HCL PF 1 MG/ML IJ SOLN
1.0000 mg | Freq: Once | INTRAMUSCULAR | Status: AC
  Administered 2012-01-01: 1 mg via INTRAVENOUS
  Filled 2012-01-01: qty 1

## 2012-01-01 NOTE — ED Notes (Signed)
BP 5 min after administration is 150/103, and HR is 74

## 2012-01-01 NOTE — ED Notes (Signed)
Pt c/o lower abdominal pain x 2 days.  Some dysuria.  Pt diaphoretic.  Some N/V.  Last BM Friday, normally has BM daily.

## 2012-01-01 NOTE — ED Notes (Signed)
BP is 137/98

## 2012-01-01 NOTE — ED Notes (Addendum)
BP 151/109, HR 84 before labetalol administration

## 2012-01-01 NOTE — ED Notes (Signed)
BP is 151/109

## 2012-01-01 NOTE — ED Notes (Signed)
BP is 154/104
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
## 2012-03-10 ENCOUNTER — Encounter (HOSPITAL_BASED_OUTPATIENT_CLINIC_OR_DEPARTMENT_OTHER): Payer: Self-pay | Admitting: *Deleted

## 2012-03-10 ENCOUNTER — Emergency Department (HOSPITAL_BASED_OUTPATIENT_CLINIC_OR_DEPARTMENT_OTHER)
Admission: EM | Admit: 2012-03-10 | Discharge: 2012-03-10 | Disposition: A | Discharge status: Home / Self Care | Payer: Self-pay | Attending: Emergency Medicine | Admitting: Emergency Medicine

## 2012-03-10 DIAGNOSIS — Z79899 Other long term (current) drug therapy: Secondary | ICD-10-CM | POA: Insufficient documentation

## 2012-03-10 DIAGNOSIS — G43909 Migraine, unspecified, not intractable, without status migrainosus: Secondary | ICD-10-CM | POA: Insufficient documentation

## 2012-03-10 DIAGNOSIS — F329 Major depressive disorder, single episode, unspecified: Secondary | ICD-10-CM | POA: Insufficient documentation

## 2012-03-10 DIAGNOSIS — I10 Essential (primary) hypertension: Secondary | ICD-10-CM | POA: Insufficient documentation

## 2012-03-10 DIAGNOSIS — Z87442 Personal history of urinary calculi: Secondary | ICD-10-CM | POA: Insufficient documentation

## 2012-03-10 DIAGNOSIS — F3289 Other specified depressive episodes: Secondary | ICD-10-CM | POA: Insufficient documentation

## 2012-03-10 DIAGNOSIS — Z8719 Personal history of other diseases of the digestive system: Secondary | ICD-10-CM | POA: Insufficient documentation

## 2012-03-10 DIAGNOSIS — Z87828 Personal history of other (healed) physical injury and trauma: Secondary | ICD-10-CM | POA: Insufficient documentation

## 2012-03-10 DIAGNOSIS — Z86718 Personal history of other venous thrombosis and embolism: Secondary | ICD-10-CM | POA: Insufficient documentation

## 2012-03-10 DIAGNOSIS — M109 Gout, unspecified: Secondary | ICD-10-CM | POA: Insufficient documentation

## 2012-03-10 MED ORDER — DIPHENHYDRAMINE HCL 50 MG/ML IJ SOLN
25.0000 mg | Freq: Once | INTRAMUSCULAR | Status: AC
  Administered 2012-03-10: 25 mg via INTRAVENOUS
  Filled 2012-03-10: qty 1

## 2012-03-10 MED ORDER — DEXAMETHASONE SODIUM PHOSPHATE 10 MG/ML IJ SOLN
10.0000 mg | Freq: Once | INTRAMUSCULAR | Status: AC
  Administered 2012-03-10: 10 mg via INTRAVENOUS
  Filled 2012-03-10: qty 1

## 2012-03-10 MED ORDER — HYDROCODONE-ACETAMINOPHEN 5-325 MG PO TABS: 2.0000 | ORAL_TABLET | ORAL | Status: DC | PRN

## 2012-03-10 MED ORDER — HYDROMORPHONE HCL PF 1 MG/ML IJ SOLN
1.0000 mg | Freq: Once | INTRAMUSCULAR | Status: AC
  Administered 2012-03-10: 1 mg via INTRAVENOUS
  Filled 2012-03-10: qty 1

## 2012-03-10 MED ORDER — METOCLOPRAMIDE HCL 5 MG/ML IJ SOLN
10.0000 mg | Freq: Once | INTRAMUSCULAR | Status: AC
  Administered 2012-03-10: 10 mg via INTRAVENOUS
  Filled 2012-03-10: qty 2

## 2012-03-10 MED ORDER — SODIUM CHLORIDE 0.9 % IV SOLN
INTRAVENOUS | Status: DC
  Administered 2012-03-10: 15:00:00 via INTRAVENOUS

## 2012-03-10 NOTE — ED Notes (Signed)
Pt presents to ED today with migraine HA since last night.  Pt reports +nosebleed last night.  Pt has dizziness and nausea.

## 2012-03-10 NOTE — ED Notes (Signed)
rx x 1 given for hydrocodone- d/c home with ride
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
## 2012-03-18 ENCOUNTER — Emergency Department (HOSPITAL_BASED_OUTPATIENT_CLINIC_OR_DEPARTMENT_OTHER)
Admission: EM | Admit: 2012-03-18 | Discharge: 2012-03-18 | Disposition: A | Discharge status: Home / Self Care | Payer: Self-pay | Attending: Emergency Medicine | Admitting: Emergency Medicine

## 2012-03-18 ENCOUNTER — Encounter (HOSPITAL_BASED_OUTPATIENT_CLINIC_OR_DEPARTMENT_OTHER): Payer: Self-pay | Admitting: Emergency Medicine

## 2012-03-18 ENCOUNTER — Emergency Department (HOSPITAL_BASED_OUTPATIENT_CLINIC_OR_DEPARTMENT_OTHER): Payer: Self-pay

## 2012-03-18 DIAGNOSIS — R109 Unspecified abdominal pain: Secondary | ICD-10-CM

## 2012-03-18 DIAGNOSIS — M109 Gout, unspecified: Secondary | ICD-10-CM | POA: Insufficient documentation

## 2012-03-18 DIAGNOSIS — Z87828 Personal history of other (healed) physical injury and trauma: Secondary | ICD-10-CM | POA: Insufficient documentation

## 2012-03-18 DIAGNOSIS — F329 Major depressive disorder, single episode, unspecified: Secondary | ICD-10-CM | POA: Insufficient documentation

## 2012-03-18 DIAGNOSIS — Z86718 Personal history of other venous thrombosis and embolism: Secondary | ICD-10-CM | POA: Insufficient documentation

## 2012-03-18 DIAGNOSIS — R1013 Epigastric pain: Secondary | ICD-10-CM | POA: Insufficient documentation

## 2012-03-18 DIAGNOSIS — Z8669 Personal history of other diseases of the nervous system and sense organs: Secondary | ICD-10-CM | POA: Insufficient documentation

## 2012-03-18 DIAGNOSIS — Z8719 Personal history of other diseases of the digestive system: Secondary | ICD-10-CM | POA: Insufficient documentation

## 2012-03-18 DIAGNOSIS — I10 Essential (primary) hypertension: Secondary | ICD-10-CM | POA: Insufficient documentation

## 2012-03-18 DIAGNOSIS — Z79899 Other long term (current) drug therapy: Secondary | ICD-10-CM | POA: Insufficient documentation

## 2012-03-18 DIAGNOSIS — R1110 Vomiting, unspecified: Secondary | ICD-10-CM | POA: Insufficient documentation

## 2012-03-18 DIAGNOSIS — F3289 Other specified depressive episodes: Secondary | ICD-10-CM | POA: Insufficient documentation

## 2012-03-18 DIAGNOSIS — Z87442 Personal history of urinary calculi: Secondary | ICD-10-CM | POA: Insufficient documentation

## 2012-03-18 HISTORY — DX: Benign neoplasm of connective and other soft tissue of abdomen: D21.4

## 2012-03-18 LAB — COMPREHENSIVE METABOLIC PANEL
Albumin: 3.2 g/dL — ABNORMAL LOW (ref 3.5–5.2)
Alkaline Phosphatase: 78 U/L (ref 39–117)
BUN: 14 mg/dL (ref 6–23)
CO2: 22 mEq/L (ref 19–32)
Chloride: 106 mEq/L (ref 96–112)
GFR calc Af Amer: 87 mL/min — ABNORMAL LOW (ref >90)
Glucose, Bld: 106 mg/dL — ABNORMAL HIGH (ref 70–99)
Potassium: 3.7 mEq/L (ref 3.5–5.1)
Total Bilirubin: 0.5 mg/dL (ref 0.3–1.2)

## 2012-03-18 LAB — URINE MICROSCOPIC-ADD ON

## 2012-03-18 LAB — CBC WITH DIFFERENTIAL/PLATELET
Basophils Relative: 0 % (ref 0–1)
Eosinophils Relative: 0 % (ref 0–5)
Lymphs Abs: 1.4 10*3/uL (ref 0.7–4.0)
MCH: 26 pg (ref 26.0–34.0)
Monocytes Absolute: 0.7 10*3/uL (ref 0.1–1.0)
Neutrophils Relative %: 77 % (ref 43–77)
Platelets: 245 10*3/uL (ref 150–400)
RBC: 5.54 MIL/uL (ref 4.22–5.81)
RDW: 15.7 % — ABNORMAL HIGH (ref 11.5–15.5)
WBC: 9.6 10*3/uL (ref 4.0–10.5)

## 2012-03-18 LAB — URINALYSIS, ROUTINE W REFLEX MICROSCOPIC
Bilirubin Urine: NEGATIVE
Specific Gravity, Urine: 1.017 (ref 1.005–1.030)
pH: 5.5 (ref 5.0–8.0)

## 2012-03-18 LAB — LIPASE, BLOOD: Lipase: 20 U/L (ref 11–59)

## 2012-03-18 MED ORDER — PROMETHAZINE HCL 25 MG PO TABS: 25.0000 mg | ORAL_TABLET | Freq: Four times a day (QID) | ORAL | Status: DC | PRN

## 2012-03-18 MED ORDER — HYDROMORPHONE HCL PF 1 MG/ML IJ SOLN
1.0000 mg | Freq: Once | INTRAMUSCULAR | Status: AC
  Administered 2012-03-18: 1 mg via INTRAVENOUS
  Filled 2012-03-18: qty 1

## 2012-03-18 MED ORDER — OXYCODONE-ACETAMINOPHEN 5-325 MG PO TABS: 1.0000 | ORAL_TABLET | Freq: Four times a day (QID) | ORAL | Status: DC | PRN

## 2012-03-18 MED ORDER — ONDANSETRON HCL 4 MG/2ML IJ SOLN
4.0000 mg | Freq: Once | INTRAMUSCULAR | Status: AC
  Administered 2012-03-18: 4 mg via INTRAVENOUS
  Filled 2012-03-18: qty 2

## 2012-03-18 MED ORDER — SODIUM CHLORIDE 0.9 % IV BOLUS (SEPSIS)
1000.0000 mL | Freq: Once | INTRAVENOUS | Status: AC
  Administered 2012-03-18: 1000 mL via INTRAVENOUS
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
## 2012-03-18 NOTE — ED Notes (Signed)
Pt states he ate peanuts and started to have abdominal pain, N/V.  No known fever.

## 2012-03-20 LAB — URINE CULTURE

## 2012-03-28 ENCOUNTER — Encounter (HOSPITAL_BASED_OUTPATIENT_CLINIC_OR_DEPARTMENT_OTHER): Payer: Self-pay | Admitting: *Deleted

## 2012-03-28 ENCOUNTER — Emergency Department (HOSPITAL_BASED_OUTPATIENT_CLINIC_OR_DEPARTMENT_OTHER)
Admission: EM | Admit: 2012-03-28 | Discharge: 2012-03-28 | Disposition: A | Discharge status: Home / Self Care | Payer: Self-pay | Attending: Emergency Medicine | Admitting: Emergency Medicine

## 2012-03-28 ENCOUNTER — Emergency Department (HOSPITAL_BASED_OUTPATIENT_CLINIC_OR_DEPARTMENT_OTHER): Payer: Self-pay

## 2012-03-28 DIAGNOSIS — Z862 Personal history of diseases of the blood and blood-forming organs and certain disorders involving the immune mechanism: Secondary | ICD-10-CM | POA: Insufficient documentation

## 2012-03-28 DIAGNOSIS — Z9889 Other specified postprocedural states: Secondary | ICD-10-CM | POA: Insufficient documentation

## 2012-03-28 DIAGNOSIS — Z8719 Personal history of other diseases of the digestive system: Secondary | ICD-10-CM | POA: Insufficient documentation

## 2012-03-28 DIAGNOSIS — M79673 Pain in unspecified foot: Secondary | ICD-10-CM

## 2012-03-28 DIAGNOSIS — Z8639 Personal history of other endocrine, nutritional and metabolic disease: Secondary | ICD-10-CM | POA: Insufficient documentation

## 2012-03-28 DIAGNOSIS — F3289 Other specified depressive episodes: Secondary | ICD-10-CM | POA: Insufficient documentation

## 2012-03-28 DIAGNOSIS — Z87442 Personal history of urinary calculi: Secondary | ICD-10-CM | POA: Insufficient documentation

## 2012-03-28 DIAGNOSIS — Z79899 Other long term (current) drug therapy: Secondary | ICD-10-CM | POA: Insufficient documentation

## 2012-03-28 DIAGNOSIS — I10 Essential (primary) hypertension: Secondary | ICD-10-CM | POA: Insufficient documentation

## 2012-03-28 DIAGNOSIS — H5440 Blindness, one eye, unspecified eye: Secondary | ICD-10-CM | POA: Insufficient documentation

## 2012-03-28 DIAGNOSIS — M25579 Pain in unspecified ankle and joints of unspecified foot: Secondary | ICD-10-CM | POA: Insufficient documentation

## 2012-03-28 DIAGNOSIS — Z87828 Personal history of other (healed) physical injury and trauma: Secondary | ICD-10-CM | POA: Insufficient documentation

## 2012-03-28 DIAGNOSIS — F329 Major depressive disorder, single episode, unspecified: Secondary | ICD-10-CM | POA: Insufficient documentation

## 2012-03-28 DIAGNOSIS — Z8669 Personal history of other diseases of the nervous system and sense organs: Secondary | ICD-10-CM | POA: Insufficient documentation

## 2012-03-28 DIAGNOSIS — Z86718 Personal history of other venous thrombosis and embolism: Secondary | ICD-10-CM | POA: Insufficient documentation

## 2012-03-28 MED ORDER — OXYCODONE-ACETAMINOPHEN 5-325 MG PO TABS: 1.0000 | ORAL_TABLET | Freq: Four times a day (QID) | ORAL | Status: DC | PRN

## 2012-03-28 MED ORDER — HYDROMORPHONE HCL PF 1 MG/ML IJ SOLN
1.0000 mg | Freq: Once | INTRAMUSCULAR | Status: AC
  Administered 2012-03-28: 1 mg via INTRAMUSCULAR
  Filled 2012-03-28: qty 1

## 2012-03-28 NOTE — ED Notes (Signed)
Pt c/o left foot and leg pain x 1 week denies injury
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
## 2012-06-02 ENCOUNTER — Emergency Department (HOSPITAL_BASED_OUTPATIENT_CLINIC_OR_DEPARTMENT_OTHER)
Admission: EM | Admit: 2012-06-02 | Discharge: 2012-06-02 | Disposition: A | Discharge status: Home / Self Care | Payer: Self-pay | Attending: Emergency Medicine | Admitting: Emergency Medicine

## 2012-06-02 ENCOUNTER — Ambulatory Visit (HOSPITAL_BASED_OUTPATIENT_CLINIC_OR_DEPARTMENT_OTHER): Payer: Self-pay

## 2012-06-02 ENCOUNTER — Encounter (HOSPITAL_BASED_OUTPATIENT_CLINIC_OR_DEPARTMENT_OTHER): Payer: Self-pay | Admitting: *Deleted

## 2012-06-02 ENCOUNTER — Emergency Department (HOSPITAL_BASED_OUTPATIENT_CLINIC_OR_DEPARTMENT_OTHER): Payer: Self-pay

## 2012-06-02 DIAGNOSIS — K5289 Other specified noninfective gastroenteritis and colitis: Secondary | ICD-10-CM | POA: Insufficient documentation

## 2012-06-02 DIAGNOSIS — Z87828 Personal history of other (healed) physical injury and trauma: Secondary | ICD-10-CM | POA: Insufficient documentation

## 2012-06-02 DIAGNOSIS — Z79899 Other long term (current) drug therapy: Secondary | ICD-10-CM | POA: Insufficient documentation

## 2012-06-02 DIAGNOSIS — Z8659 Personal history of other mental and behavioral disorders: Secondary | ICD-10-CM | POA: Insufficient documentation

## 2012-06-02 DIAGNOSIS — Z8719 Personal history of other diseases of the digestive system: Secondary | ICD-10-CM | POA: Insufficient documentation

## 2012-06-02 DIAGNOSIS — K529 Noninfective gastroenteritis and colitis, unspecified: Secondary | ICD-10-CM

## 2012-06-02 DIAGNOSIS — Z86718 Personal history of other venous thrombosis and embolism: Secondary | ICD-10-CM | POA: Insufficient documentation

## 2012-06-02 DIAGNOSIS — R109 Unspecified abdominal pain: Secondary | ICD-10-CM

## 2012-06-02 DIAGNOSIS — R61 Generalized hyperhidrosis: Secondary | ICD-10-CM | POA: Insufficient documentation

## 2012-06-02 DIAGNOSIS — Z8639 Personal history of other endocrine, nutritional and metabolic disease: Secondary | ICD-10-CM | POA: Insufficient documentation

## 2012-06-02 DIAGNOSIS — Z862 Personal history of diseases of the blood and blood-forming organs and certain disorders involving the immune mechanism: Secondary | ICD-10-CM | POA: Insufficient documentation

## 2012-06-02 DIAGNOSIS — R1084 Generalized abdominal pain: Secondary | ICD-10-CM | POA: Insufficient documentation

## 2012-06-02 DIAGNOSIS — Z87442 Personal history of urinary calculi: Secondary | ICD-10-CM | POA: Insufficient documentation

## 2012-06-02 DIAGNOSIS — R6883 Chills (without fever): Secondary | ICD-10-CM | POA: Insufficient documentation

## 2012-06-02 DIAGNOSIS — Z8679 Personal history of other diseases of the circulatory system: Secondary | ICD-10-CM | POA: Insufficient documentation

## 2012-06-02 DIAGNOSIS — R112 Nausea with vomiting, unspecified: Secondary | ICD-10-CM | POA: Insufficient documentation

## 2012-06-02 LAB — CBC
HCT: 40.6 % (ref 39.0–52.0)
Hemoglobin: 13.4 g/dL (ref 13.0–17.0)
MCH: 25.7 pg — ABNORMAL LOW (ref 26.0–34.0)
MCHC: 33 g/dL (ref 30.0–36.0)
MCV: 77.8 fL — ABNORMAL LOW (ref 78.0–100.0)
Platelets: 263 10*3/uL (ref 150–400)
RBC: 5.22 MIL/uL (ref 4.22–5.81)
RDW: 14.9 % (ref 11.5–15.5)
WBC: 9 10*3/uL (ref 4.0–10.5)

## 2012-06-02 LAB — URINALYSIS, ROUTINE W REFLEX MICROSCOPIC
Ketones, ur: NEGATIVE mg/dL
Nitrite: NEGATIVE
pH: 5.5 (ref 5.0–8.0)

## 2012-06-02 LAB — COMPREHENSIVE METABOLIC PANEL
ALT: 8 U/L (ref 0–53)
AST: 12 U/L (ref 0–37)
CO2: 25 mEq/L (ref 19–32)
Chloride: 101 mEq/L (ref 96–112)
Creatinine, Ser: 1.3 mg/dL (ref 0.50–1.35)
GFR calc non Af Amer: 61 mL/min — ABNORMAL LOW (ref >90)
Sodium: 137 mEq/L (ref 135–145)
Total Bilirubin: 0.6 mg/dL (ref 0.3–1.2)

## 2012-06-02 LAB — LIPASE, BLOOD: Lipase: 82 U/L — ABNORMAL HIGH (ref 11–59)

## 2012-06-02 LAB — URINE MICROSCOPIC-ADD ON

## 2012-06-02 MED ORDER — HYDROMORPHONE HCL PF 1 MG/ML IJ SOLN
1.0000 mg | INTRAMUSCULAR | Status: DC | PRN
  Administered 2012-06-02: 1 mg via INTRAVENOUS
  Filled 2012-06-02: qty 1

## 2012-06-02 MED ORDER — SODIUM CHLORIDE 0.9 % IV BOLUS (SEPSIS)
1000.0000 mL | Freq: Once | INTRAVENOUS | Status: AC
  Administered 2012-06-02: 1000 mL via INTRAVENOUS

## 2012-06-02 MED ORDER — ONDANSETRON 8 MG PO TBDP: 8.0000 mg | ORAL_TABLET | Freq: Three times a day (TID) | ORAL | Status: DC | PRN

## 2012-06-02 MED ORDER — METRONIDAZOLE IN NACL 5-0.79 MG/ML-% IV SOLN
500.0000 mg | Freq: Once | INTRAVENOUS | Status: AC
  Administered 2012-06-02: 500 mg via INTRAVENOUS
  Filled 2012-06-02: qty 100

## 2012-06-02 MED ORDER — IOHEXOL 300 MG/ML  SOLN
100.0000 mL | Freq: Once | INTRAMUSCULAR | Status: AC | PRN
  Administered 2012-06-02: 100 mL via INTRAVENOUS

## 2012-06-02 MED ORDER — MORPHINE SULFATE 4 MG/ML IJ SOLN
4.0000 mg | INTRAMUSCULAR | Status: DC | PRN
  Administered 2012-06-02: 4 mg via INTRAVENOUS
  Filled 2012-06-02: qty 1

## 2012-06-02 MED ORDER — SODIUM CHLORIDE 0.9 % IV SOLN
Freq: Once | INTRAVENOUS | Status: AC
  Administered 2012-06-02: 21:00:00 via INTRAVENOUS

## 2012-06-02 MED ORDER — CIPROFLOXACIN IN D5W 400 MG/200ML IV SOLN
400.0000 mg | Freq: Once | INTRAVENOUS | Status: AC
  Administered 2012-06-02: 400 mg via INTRAVENOUS
  Filled 2012-06-02: qty 200

## 2012-06-02 MED ORDER — METRONIDAZOLE 500 MG PO TABS: 500.0000 mg | ORAL_TABLET | Freq: Two times a day (BID) | ORAL | Status: AC

## 2012-06-02 MED ORDER — ONDANSETRON HCL 4 MG/2ML IJ SOLN
4.0000 mg | Freq: Once | INTRAMUSCULAR | Status: AC
  Administered 2012-06-02: 4 mg via INTRAVENOUS
  Filled 2012-06-02: qty 2

## 2012-06-02 MED ORDER — OXYCODONE-ACETAMINOPHEN 5-325 MG PO TABS: 1.0000 | ORAL_TABLET | ORAL | Status: DC | PRN

## 2012-06-02 MED ORDER — CIPROFLOXACIN HCL 500 MG PO TABS: 500.0000 mg | ORAL_TABLET | Freq: Two times a day (BID) | ORAL | Status: AC

## 2012-06-02 MED ORDER — IOHEXOL 300 MG/ML  SOLN
50.0000 mL | Freq: Once | INTRAMUSCULAR | Status: AC | PRN
  Administered 2012-06-02: 50 mL via ORAL

## 2012-06-02 NOTE — ED Notes (Signed)
Rx x 4 given for percocet, zofran, cipro and flagyl
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
## 2012-06-02 NOTE — ED Notes (Signed)
Generalized abd pain since yesterday. Vomited x 4 today.

## 2012-06-02 NOTE — ED Provider Notes (Signed)
I saw and evaluated the patient, reviewed the resident's note and I agree with the findings and plan.   .Face to face Exam:  General:  Awake HEENT:  Atraumatic Resp:  Normal effort Abd:  Nondistended Neuro:No focal weakness Lymph: No adenopathy   Nelia Shi, MD 06/02/12 2302

## 2012-06-02 NOTE — ED Notes (Signed)
Doctor approved crackers in addition to fluid challenge.

## 2012-06-04 LAB — URINE CULTURE: Colony Count: 50000

## 2012-06-05 NOTE — ED Notes (Signed)
+   Urine Patient treated with Cipro-sensitive to same-chart appended per protocol MD. 

## 2012-07-07 ENCOUNTER — Emergency Department (HOSPITAL_BASED_OUTPATIENT_CLINIC_OR_DEPARTMENT_OTHER)
Admission: EM | Admit: 2012-07-07 | Discharge: 2012-07-07 | Disposition: A | Discharge status: Home / Self Care | Payer: Self-pay | Attending: Emergency Medicine | Admitting: Emergency Medicine

## 2012-07-07 ENCOUNTER — Encounter (HOSPITAL_BASED_OUTPATIENT_CLINIC_OR_DEPARTMENT_OTHER): Payer: Self-pay | Admitting: *Deleted

## 2012-07-07 DIAGNOSIS — F3289 Other specified depressive episodes: Secondary | ICD-10-CM | POA: Insufficient documentation

## 2012-07-07 DIAGNOSIS — F329 Major depressive disorder, single episode, unspecified: Secondary | ICD-10-CM | POA: Insufficient documentation

## 2012-07-07 DIAGNOSIS — Z87442 Personal history of urinary calculi: Secondary | ICD-10-CM | POA: Insufficient documentation

## 2012-07-07 DIAGNOSIS — Z79899 Other long term (current) drug therapy: Secondary | ICD-10-CM | POA: Insufficient documentation

## 2012-07-07 DIAGNOSIS — I10 Essential (primary) hypertension: Secondary | ICD-10-CM | POA: Insufficient documentation

## 2012-07-07 DIAGNOSIS — Z86718 Personal history of other venous thrombosis and embolism: Secondary | ICD-10-CM | POA: Insufficient documentation

## 2012-07-07 DIAGNOSIS — R110 Nausea: Secondary | ICD-10-CM | POA: Insufficient documentation

## 2012-07-07 DIAGNOSIS — H538 Other visual disturbances: Secondary | ICD-10-CM | POA: Insufficient documentation

## 2012-07-07 DIAGNOSIS — Z87828 Personal history of other (healed) physical injury and trauma: Secondary | ICD-10-CM | POA: Insufficient documentation

## 2012-07-07 DIAGNOSIS — Z872 Personal history of diseases of the skin and subcutaneous tissue: Secondary | ICD-10-CM | POA: Insufficient documentation

## 2012-07-07 DIAGNOSIS — M109 Gout, unspecified: Secondary | ICD-10-CM | POA: Insufficient documentation

## 2012-07-07 DIAGNOSIS — G43909 Migraine, unspecified, not intractable, without status migrainosus: Secondary | ICD-10-CM | POA: Insufficient documentation

## 2012-07-07 DIAGNOSIS — Z8719 Personal history of other diseases of the digestive system: Secondary | ICD-10-CM | POA: Insufficient documentation

## 2012-07-07 DIAGNOSIS — H5440 Blindness, one eye, unspecified eye: Secondary | ICD-10-CM | POA: Insufficient documentation

## 2012-07-07 MED ORDER — CLONIDINE HCL 0.1 MG PO TABS
0.1000 mg | ORAL_TABLET | Freq: Once | ORAL | Status: AC
  Administered 2012-07-07: 0.1 mg via ORAL
  Filled 2012-07-07: qty 1

## 2012-07-07 MED ORDER — KETOROLAC TROMETHAMINE 60 MG/2ML IM SOLN
60.0000 mg | Freq: Once | INTRAMUSCULAR | Status: AC
  Administered 2012-07-07: 60 mg via INTRAMUSCULAR
  Filled 2012-07-07: qty 2

## 2012-07-07 MED ORDER — MORPHINE SULFATE 4 MG/ML IJ SOLN
4.0000 mg | Freq: Once | INTRAMUSCULAR | Status: AC
  Administered 2012-07-07: 4 mg via INTRAMUSCULAR
  Filled 2012-07-07: qty 1

## 2012-07-07 MED ORDER — KETOROLAC TROMETHAMINE 30 MG/ML IJ SOLN: 30.0000 mg | Freq: Once | INTRAMUSCULAR | Status: DC

## 2012-07-07 MED ORDER — METOCLOPRAMIDE HCL 5 MG/ML IJ SOLN: 10.0000 mg | Freq: Once | INTRAMUSCULAR | Status: DC

## 2012-07-07 MED ORDER — METOCLOPRAMIDE HCL 10 MG PO TABS
10.0000 mg | ORAL_TABLET | Freq: Once | ORAL | Status: AC
  Administered 2012-07-07: 10 mg via ORAL
  Filled 2012-07-07: qty 1

## 2012-07-07 MED ORDER — CLONIDINE HCL 0.2 MG PO TABS: 0.2000 mg | ORAL_TABLET | Freq: Two times a day (BID) | ORAL | Status: DC

## 2012-07-07 MED ORDER — METOPROLOL TARTRATE 25 MG PO TABS: 25.0000 mg | ORAL_TABLET | Freq: Two times a day (BID) | ORAL | Status: DC

## 2012-07-07 MED ORDER — METOCLOPRAMIDE HCL 10 MG PO TABS: 10.0000 mg | ORAL_TABLET | Freq: Four times a day (QID) | ORAL | Status: DC | PRN

## 2012-07-07 MED ORDER — HYDROCODONE-ACETAMINOPHEN 5-325 MG PO TABS: 1.0000 | ORAL_TABLET | Freq: Four times a day (QID) | ORAL | Status: DC | PRN

## 2012-07-07 MED ORDER — IBUPROFEN 600 MG PO TABS: 600.0000 mg | ORAL_TABLET | Freq: Four times a day (QID) | ORAL | Status: DC | PRN

## 2012-07-07 MED ORDER — MORPHINE SULFATE 4 MG/ML IJ SOLN: 4.0000 mg | Freq: Once | INTRAMUSCULAR | Status: DC
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
## 2012-07-07 NOTE — ED Notes (Signed)
Headache, N/V since Friday

## 2012-08-05 ENCOUNTER — Emergency Department (HOSPITAL_BASED_OUTPATIENT_CLINIC_OR_DEPARTMENT_OTHER)
Admission: EM | Admit: 2012-08-05 | Discharge: 2012-08-05 | Disposition: A | Discharge status: Home / Self Care | Payer: Self-pay | Attending: Emergency Medicine | Admitting: Emergency Medicine

## 2012-08-05 ENCOUNTER — Encounter (HOSPITAL_BASED_OUTPATIENT_CLINIC_OR_DEPARTMENT_OTHER): Payer: Self-pay | Admitting: *Deleted

## 2012-08-05 ENCOUNTER — Emergency Department (HOSPITAL_BASED_OUTPATIENT_CLINIC_OR_DEPARTMENT_OTHER): Payer: Self-pay

## 2012-08-05 DIAGNOSIS — Z8659 Personal history of other mental and behavioral disorders: Secondary | ICD-10-CM | POA: Insufficient documentation

## 2012-08-05 DIAGNOSIS — M545 Low back pain, unspecified: Secondary | ICD-10-CM | POA: Insufficient documentation

## 2012-08-05 DIAGNOSIS — Z872 Personal history of diseases of the skin and subcutaneous tissue: Secondary | ICD-10-CM | POA: Insufficient documentation

## 2012-08-05 DIAGNOSIS — G8929 Other chronic pain: Secondary | ICD-10-CM | POA: Insufficient documentation

## 2012-08-05 DIAGNOSIS — Z8719 Personal history of other diseases of the digestive system: Secondary | ICD-10-CM | POA: Insufficient documentation

## 2012-08-05 DIAGNOSIS — M549 Dorsalgia, unspecified: Secondary | ICD-10-CM

## 2012-08-05 DIAGNOSIS — Z79899 Other long term (current) drug therapy: Secondary | ICD-10-CM | POA: Insufficient documentation

## 2012-08-05 DIAGNOSIS — Z86718 Personal history of other venous thrombosis and embolism: Secondary | ICD-10-CM | POA: Insufficient documentation

## 2012-08-05 DIAGNOSIS — Z87828 Personal history of other (healed) physical injury and trauma: Secondary | ICD-10-CM | POA: Insufficient documentation

## 2012-08-05 DIAGNOSIS — Z8679 Personal history of other diseases of the circulatory system: Secondary | ICD-10-CM | POA: Insufficient documentation

## 2012-08-05 DIAGNOSIS — Z862 Personal history of diseases of the blood and blood-forming organs and certain disorders involving the immune mechanism: Secondary | ICD-10-CM | POA: Insufficient documentation

## 2012-08-05 DIAGNOSIS — Z87442 Personal history of urinary calculi: Secondary | ICD-10-CM | POA: Insufficient documentation

## 2012-08-05 DIAGNOSIS — Z8639 Personal history of other endocrine, nutritional and metabolic disease: Secondary | ICD-10-CM | POA: Insufficient documentation

## 2012-08-05 MED ORDER — KETOROLAC TROMETHAMINE 60 MG/2ML IM SOLN
60.0000 mg | Freq: Once | INTRAMUSCULAR | Status: AC
  Administered 2012-08-05: 60 mg via INTRAMUSCULAR
  Filled 2012-08-05: qty 2

## 2012-08-05 MED ORDER — OXYCODONE-ACETAMINOPHEN 5-325 MG PO TABS: 1.0000 | ORAL_TABLET | ORAL | Status: DC | PRN

## 2012-08-05 MED ORDER — CYCLOBENZAPRINE HCL 10 MG PO TABS: 10.0000 mg | ORAL_TABLET | Freq: Two times a day (BID) | ORAL | Status: DC | PRN

## 2012-08-05 MED ORDER — HYDROMORPHONE HCL PF 1 MG/ML IJ SOLN
1.0000 mg | Freq: Once | INTRAMUSCULAR | Status: AC
  Administered 2012-08-05: 1 mg via INTRAMUSCULAR
  Filled 2012-08-05: qty 1

## 2012-08-05 MED ORDER — DIAZEPAM 5 MG PO TABS
5.0000 mg | ORAL_TABLET | Freq: Once | ORAL | Status: AC
  Administered 2012-08-05: 5 mg via ORAL
  Filled 2012-08-05: qty 1
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
## 2012-08-07 NOTE — ED Provider Notes (Signed)
Medical screening examination/treatment/procedure(s) were performed by non-physician practitioner and as supervising physician I was immediately available for consultation/collaboration.   Gwyneth Sprout, MD 08/07/12 2126

## 2012-08-18 ENCOUNTER — Encounter (HOSPITAL_BASED_OUTPATIENT_CLINIC_OR_DEPARTMENT_OTHER): Payer: Self-pay | Admitting: *Deleted

## 2012-08-18 ENCOUNTER — Emergency Department (HOSPITAL_BASED_OUTPATIENT_CLINIC_OR_DEPARTMENT_OTHER)
Admission: EM | Admit: 2012-08-18 | Discharge: 2012-08-19 | Disposition: A | Discharge status: Home / Self Care | Payer: Self-pay | Attending: Emergency Medicine | Admitting: Emergency Medicine

## 2012-08-18 DIAGNOSIS — M549 Dorsalgia, unspecified: Secondary | ICD-10-CM | POA: Insufficient documentation

## 2012-08-18 DIAGNOSIS — Z8639 Personal history of other endocrine, nutritional and metabolic disease: Secondary | ICD-10-CM | POA: Insufficient documentation

## 2012-08-18 DIAGNOSIS — Z79899 Other long term (current) drug therapy: Secondary | ICD-10-CM | POA: Insufficient documentation

## 2012-08-18 DIAGNOSIS — Z8659 Personal history of other mental and behavioral disorders: Secondary | ICD-10-CM | POA: Insufficient documentation

## 2012-08-18 DIAGNOSIS — I10 Essential (primary) hypertension: Secondary | ICD-10-CM | POA: Insufficient documentation

## 2012-08-18 DIAGNOSIS — Z862 Personal history of diseases of the blood and blood-forming organs and certain disorders involving the immune mechanism: Secondary | ICD-10-CM | POA: Insufficient documentation

## 2012-08-18 DIAGNOSIS — Z86718 Personal history of other venous thrombosis and embolism: Secondary | ICD-10-CM | POA: Insufficient documentation

## 2012-08-18 DIAGNOSIS — Z8679 Personal history of other diseases of the circulatory system: Secondary | ICD-10-CM | POA: Insufficient documentation

## 2012-08-18 DIAGNOSIS — Z8719 Personal history of other diseases of the digestive system: Secondary | ICD-10-CM | POA: Insufficient documentation

## 2012-08-18 DIAGNOSIS — Z87442 Personal history of urinary calculi: Secondary | ICD-10-CM | POA: Insufficient documentation

## 2012-08-18 DIAGNOSIS — Z87828 Personal history of other (healed) physical injury and trauma: Secondary | ICD-10-CM | POA: Insufficient documentation
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
## 2012-08-19 MED ORDER — OXYCODONE-ACETAMINOPHEN 5-325 MG PO TABS
2.0000 | ORAL_TABLET | Freq: Once | ORAL | Status: AC
  Administered 2012-08-19: 2 via ORAL
  Filled 2012-08-19 (×2): qty 2
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
## 2012-08-19 NOTE — ED Notes (Signed)
Dr. Bebe Shaggy aware of pt's b/p 195/112 at d/c. No further orders received at this time.

## 2012-09-09 ENCOUNTER — Encounter (HOSPITAL_BASED_OUTPATIENT_CLINIC_OR_DEPARTMENT_OTHER): Payer: Self-pay | Admitting: *Deleted

## 2012-09-09 ENCOUNTER — Emergency Department (HOSPITAL_BASED_OUTPATIENT_CLINIC_OR_DEPARTMENT_OTHER)
Admission: EM | Admit: 2012-09-09 | Discharge: 2012-09-09 | Disposition: A | Discharge status: Home / Self Care | Payer: BC Managed Care – PPO | Attending: Emergency Medicine | Admitting: Emergency Medicine

## 2012-09-09 DIAGNOSIS — Z8719 Personal history of other diseases of the digestive system: Secondary | ICD-10-CM | POA: Insufficient documentation

## 2012-09-09 DIAGNOSIS — Z8739 Personal history of other diseases of the musculoskeletal system and connective tissue: Secondary | ICD-10-CM | POA: Insufficient documentation

## 2012-09-09 DIAGNOSIS — Z862 Personal history of diseases of the blood and blood-forming organs and certain disorders involving the immune mechanism: Secondary | ICD-10-CM | POA: Insufficient documentation

## 2012-09-09 DIAGNOSIS — Z86718 Personal history of other venous thrombosis and embolism: Secondary | ICD-10-CM | POA: Insufficient documentation

## 2012-09-09 DIAGNOSIS — Z8639 Personal history of other endocrine, nutritional and metabolic disease: Secondary | ICD-10-CM | POA: Insufficient documentation

## 2012-09-09 DIAGNOSIS — G43909 Migraine, unspecified, not intractable, without status migrainosus: Secondary | ICD-10-CM | POA: Insufficient documentation

## 2012-09-09 DIAGNOSIS — R110 Nausea: Secondary | ICD-10-CM | POA: Insufficient documentation

## 2012-09-09 DIAGNOSIS — Z87828 Personal history of other (healed) physical injury and trauma: Secondary | ICD-10-CM | POA: Insufficient documentation

## 2012-09-09 DIAGNOSIS — Z79899 Other long term (current) drug therapy: Secondary | ICD-10-CM | POA: Insufficient documentation

## 2012-09-09 DIAGNOSIS — I10 Essential (primary) hypertension: Secondary | ICD-10-CM | POA: Insufficient documentation

## 2012-09-09 DIAGNOSIS — R040 Epistaxis: Secondary | ICD-10-CM | POA: Insufficient documentation

## 2012-09-09 DIAGNOSIS — Z87442 Personal history of urinary calculi: Secondary | ICD-10-CM | POA: Insufficient documentation

## 2012-09-09 DIAGNOSIS — F329 Major depressive disorder, single episode, unspecified: Secondary | ICD-10-CM | POA: Insufficient documentation

## 2012-09-09 DIAGNOSIS — F3289 Other specified depressive episodes: Secondary | ICD-10-CM | POA: Insufficient documentation

## 2012-09-09 DIAGNOSIS — H53149 Visual discomfort, unspecified: Secondary | ICD-10-CM | POA: Insufficient documentation

## 2012-09-09 MED ORDER — KETOROLAC TROMETHAMINE 30 MG/ML IJ SOLN
60.0000 mg | Freq: Once | INTRAMUSCULAR | Status: AC
  Administered 2012-09-09: 60 mg via INTRAMUSCULAR
  Filled 2012-09-09: qty 2

## 2012-09-09 MED ORDER — METOCLOPRAMIDE HCL 5 MG/ML IJ SOLN
10.0000 mg | Freq: Once | INTRAMUSCULAR | Status: AC
  Administered 2012-09-09: 10 mg via INTRAMUSCULAR
  Filled 2012-09-09: qty 2

## 2012-09-09 MED ORDER — HYDROMORPHONE HCL PF 2 MG/ML IJ SOLN
2.0000 mg | Freq: Once | INTRAMUSCULAR | Status: AC
  Administered 2012-09-09: 2 mg via INTRAMUSCULAR
  Filled 2012-09-09: qty 1
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
## 2012-09-09 NOTE — ED Notes (Signed)
MD at bedside. 

## 2012-09-09 NOTE — ED Notes (Signed)
Pt has hx of migraines and this one started Friday. Tried  To see Dr yesterday, but they would not see him. Nosebleed earlier, but none now.

## 2012-10-24 ENCOUNTER — Ambulatory Visit (INDEPENDENT_AMBULATORY_CARE_PROVIDER_SITE_OTHER): Payer: BC Managed Care – PPO | Admitting: Podiatry

## 2012-10-24 ENCOUNTER — Encounter: Payer: Self-pay | Admitting: Podiatry

## 2012-10-24 DIAGNOSIS — M7742 Metatarsalgia, left foot: Secondary | ICD-10-CM

## 2012-10-24 DIAGNOSIS — M7750 Other enthesopathy of unspecified foot: Secondary | ICD-10-CM

## 2012-10-24 DIAGNOSIS — L84 Corns and callosities: Secondary | ICD-10-CM

## 2012-10-24 DIAGNOSIS — M25579 Pain in unspecified ankle and joints of unspecified foot: Secondary | ICD-10-CM | POA: Insufficient documentation

## 2012-10-24 DIAGNOSIS — M25572 Pain in left ankle and joints of left foot: Secondary | ICD-10-CM

## 2012-10-24 MED ORDER — OXYCODONE-ACETAMINOPHEN 7.5-325 MG PO TABS: 1.0000 | ORAL_TABLET | ORAL | Status: DC | PRN

## 2012-10-24 NOTE — Progress Notes (Signed)
Corn in between 2nd and 3rd with a painful corn on 2nd lateral. Had pain and swelling on left forefoot (2nd-4th MPJ dorsum) x 2 weeks.  Had a gout in big joint in February and had to be in ER. Since then left foot hurts off and on. For the last 2 weeks, it has been bad.   Objective: Interdigital corn 2nd left. Hallux valgus with excess pressure to 2nd toe left. Varus rotated 4th and 5th toes left. Pain 2nd - 4th MPJ left. X-ray to rule out stress fracture. Findings are normal without any acute changes.  Assessment: Deviated digits with interdigital corn on 2nd left. Varus rotated 4th and 5th, valgus rotated 1st left.  Plan: Debrided painful corn. Findings and available treatment options reviewed.

## 2012-10-29 ENCOUNTER — Telehealth: Payer: Self-pay | Admitting: Podiatry

## 2012-10-29 NOTE — Telephone Encounter (Signed)
Dr. Gerlean Ren Mcclenney called this am and request to have his rx changed from oxycodone to something else. His present insurance charges 65.00 for this one and he wants something else.I told him to come in after 1:30 this afternoon and bring his rx.

## 2012-11-06 ENCOUNTER — Ambulatory Visit (INDEPENDENT_AMBULATORY_CARE_PROVIDER_SITE_OTHER): Payer: BC Managed Care – PPO | Admitting: Podiatry

## 2012-11-06 DIAGNOSIS — M25579 Pain in unspecified ankle and joints of unspecified foot: Secondary | ICD-10-CM

## 2012-11-06 DIAGNOSIS — M7742 Metatarsalgia, left foot: Secondary | ICD-10-CM

## 2012-11-06 DIAGNOSIS — M7750 Other enthesopathy of unspecified foot: Secondary | ICD-10-CM

## 2012-11-06 MED ORDER — OXYCODONE-ACETAMINOPHEN 7.5-325 MG PO TABS: 1.0000 | ORAL_TABLET | Freq: Four times a day (QID) | ORAL | Status: DC | PRN

## 2012-11-06 MED ORDER — NABUMETONE 750 MG PO TABS: 750.0000 mg | ORAL_TABLET | Freq: Two times a day (BID) | ORAL | Status: DC

## 2012-11-06 NOTE — Progress Notes (Signed)
Subjective: Both feet are bothering. Can't bend toes and hurts on bunion area right. Pain is so bad, hardly can walk at the end of work each day.  Objective: Severe hallux valgus on right. Overlapping first and 2nd toe left.  No edema or erythema noted.  Assessment: Metatarsalgia bilateral. HAV with Bunion pain right. Hammer toe pain 2nd bilateral.   Plan: Will try Orthotics, shoe modification, pain medication, possible corrective surgery in future.

## 2012-11-17 ENCOUNTER — Emergency Department (HOSPITAL_BASED_OUTPATIENT_CLINIC_OR_DEPARTMENT_OTHER)
Admission: EM | Admit: 2012-11-17 | Discharge: 2012-11-17 | Disposition: A | Discharge status: Home / Self Care | Payer: BC Managed Care – PPO | Attending: Emergency Medicine | Admitting: Emergency Medicine

## 2012-11-17 ENCOUNTER — Emergency Department (HOSPITAL_BASED_OUTPATIENT_CLINIC_OR_DEPARTMENT_OTHER): Payer: BC Managed Care – PPO

## 2012-11-17 DIAGNOSIS — Z8639 Personal history of other endocrine, nutritional and metabolic disease: Secondary | ICD-10-CM | POA: Insufficient documentation

## 2012-11-17 DIAGNOSIS — Z87442 Personal history of urinary calculi: Secondary | ICD-10-CM | POA: Insufficient documentation

## 2012-11-17 DIAGNOSIS — Z8679 Personal history of other diseases of the circulatory system: Secondary | ICD-10-CM | POA: Insufficient documentation

## 2012-11-17 DIAGNOSIS — Z8719 Personal history of other diseases of the digestive system: Secondary | ICD-10-CM | POA: Insufficient documentation

## 2012-11-17 DIAGNOSIS — I10 Essential (primary) hypertension: Secondary | ICD-10-CM | POA: Insufficient documentation

## 2012-11-17 DIAGNOSIS — R1013 Epigastric pain: Secondary | ICD-10-CM

## 2012-11-17 DIAGNOSIS — F329 Major depressive disorder, single episode, unspecified: Secondary | ICD-10-CM | POA: Insufficient documentation

## 2012-11-17 DIAGNOSIS — Z87828 Personal history of other (healed) physical injury and trauma: Secondary | ICD-10-CM | POA: Insufficient documentation

## 2012-11-17 DIAGNOSIS — F3289 Other specified depressive episodes: Secondary | ICD-10-CM | POA: Insufficient documentation

## 2012-11-17 DIAGNOSIS — R112 Nausea with vomiting, unspecified: Secondary | ICD-10-CM

## 2012-11-17 DIAGNOSIS — Z79899 Other long term (current) drug therapy: Secondary | ICD-10-CM | POA: Insufficient documentation

## 2012-11-17 DIAGNOSIS — Z86718 Personal history of other venous thrombosis and embolism: Secondary | ICD-10-CM | POA: Insufficient documentation

## 2012-11-17 DIAGNOSIS — Z862 Personal history of diseases of the blood and blood-forming organs and certain disorders involving the immune mechanism: Secondary | ICD-10-CM | POA: Insufficient documentation

## 2012-11-17 LAB — URINE MICROSCOPIC-ADD ON

## 2012-11-17 LAB — URINALYSIS, ROUTINE W REFLEX MICROSCOPIC
Bilirubin Urine: NEGATIVE
Ketones, ur: NEGATIVE mg/dL
Specific Gravity, Urine: 1.014 (ref 1.005–1.030)
Urobilinogen, UA: 0.2 mg/dL (ref 0.0–1.0)
pH: 6 (ref 5.0–8.0)

## 2012-11-17 LAB — LIPASE, BLOOD: Lipase: 36 U/L (ref 11–59)

## 2012-11-17 LAB — COMPREHENSIVE METABOLIC PANEL
Alkaline Phosphatase: 85 U/L (ref 39–117)
BUN: 21 mg/dL (ref 6–23)
Calcium: 9.6 mg/dL (ref 8.4–10.5)
Creatinine, Ser: 1.5 mg/dL — ABNORMAL HIGH (ref 0.50–1.35)
GFR calc Af Amer: 60 mL/min — ABNORMAL LOW (ref >90)
Glucose, Bld: 91 mg/dL (ref 70–99)
Total Protein: 7 g/dL (ref 6.0–8.3)

## 2012-11-17 LAB — CBC WITH DIFFERENTIAL/PLATELET
Eosinophils Absolute: 0.3 10*3/uL (ref 0.0–0.7)
Eosinophils Relative: 5 % (ref 0–5)
Hemoglobin: 11.8 g/dL — ABNORMAL LOW (ref 13.0–17.0)
Lymphs Abs: 2.1 10*3/uL (ref 0.7–4.0)
MCH: 26.4 pg (ref 26.0–34.0)
MCHC: 32.2 g/dL (ref 30.0–36.0)
MCV: 81.9 fL (ref 78.0–100.0)
Monocytes Relative: 11 % (ref 3–12)
RBC: 4.47 MIL/uL (ref 4.22–5.81)

## 2012-11-17 MED ORDER — PANTOPRAZOLE SODIUM 40 MG IV SOLR
40.0000 mg | Freq: Once | INTRAVENOUS | Status: AC
  Administered 2012-11-17: 40 mg via INTRAVENOUS
  Filled 2012-11-17: qty 40

## 2012-11-17 MED ORDER — SODIUM CHLORIDE 0.9 % IV SOLN
INTRAVENOUS | Status: DC
  Administered 2012-11-17: 17:00:00 via INTRAVENOUS

## 2012-11-17 MED ORDER — SODIUM CHLORIDE 0.9 % IV BOLUS (SEPSIS)
1000.0000 mL | Freq: Once | INTRAVENOUS | Status: AC
  Administered 2012-11-17: 1000 mL via INTRAVENOUS

## 2012-11-17 MED ORDER — MORPHINE SULFATE 2 MG/ML IJ SOLN
INTRAMUSCULAR | Status: AC
  Filled 2012-11-17: qty 1

## 2012-11-17 MED ORDER — MORPHINE SULFATE 4 MG/ML IJ SOLN
6.0000 mg | Freq: Once | INTRAMUSCULAR | Status: AC
  Administered 2012-11-17: 6 mg via INTRAVENOUS
  Filled 2012-11-17: qty 1

## 2012-11-17 MED ORDER — ONDANSETRON HCL 4 MG PO TABS: 4.0000 mg | ORAL_TABLET | Freq: Four times a day (QID) | ORAL | Status: DC

## 2012-11-17 MED ORDER — ONDANSETRON HCL 4 MG/2ML IJ SOLN
4.0000 mg | Freq: Once | INTRAMUSCULAR | Status: AC
  Administered 2012-11-17: 4 mg via INTRAVENOUS
  Filled 2012-11-17: qty 2

## 2012-11-17 MED ORDER — MORPHINE SULFATE 4 MG/ML IJ SOLN
4.0000 mg | Freq: Once | INTRAMUSCULAR | Status: AC
Start: 2012-11-17 — End: 2012-11-17
  Administered 2012-11-17: 4 mg via INTRAVENOUS
  Filled 2012-11-17: qty 1

## 2012-11-17 NOTE — ED Notes (Signed)
Abdominal pain, sharp, intermittent, diffuse central abdomen. Decreased PO intake. Last BM yesterday, small amount, normal. Has taken toradol this am, no improvement.

## 2012-11-17 NOTE — ED Provider Notes (Signed)
Medical screening examination/treatment/procedure(s) were performed by non-physician practitioner and as supervising physician I was immediately available for consultation/collaboration.   Taylie Helder, MD 11/17/12 2346 
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
## 2012-12-04 ENCOUNTER — Ambulatory Visit (INDEPENDENT_AMBULATORY_CARE_PROVIDER_SITE_OTHER): Payer: BC Managed Care – PPO | Admitting: Podiatry

## 2012-12-04 DIAGNOSIS — M25579 Pain in unspecified ankle and joints of unspecified foot: Secondary | ICD-10-CM

## 2012-12-04 DIAGNOSIS — L851 Acquired keratosis [keratoderma] palmaris et plantaris: Secondary | ICD-10-CM | POA: Insufficient documentation

## 2012-12-04 DIAGNOSIS — M25572 Pain in left ankle and joints of left foot: Secondary | ICD-10-CM

## 2012-12-04 DIAGNOSIS — M7742 Metatarsalgia, left foot: Secondary | ICD-10-CM

## 2012-12-04 DIAGNOSIS — M21962 Unspecified acquired deformity of left lower leg: Secondary | ICD-10-CM | POA: Insufficient documentation

## 2012-12-04 MED ORDER — OXYCODONE-ACETAMINOPHEN 7.5-325 MG PO TABS: 1.0000 | ORAL_TABLET | Freq: Four times a day (QID) | ORAL | Status: DC | PRN

## 2012-12-04 NOTE — Progress Notes (Signed)
Subjective: 52 y.o. year old male patient presents complaining of pain under ball of left foot. He has a hard corn like lesion that is painful to ambulate.   Reviewed medication, allergies, medical and surgical histories.  Objective: Dermatologic: Thick hard keratotic lesion under ball of left foot just proximal to the 3rd MPJ area.  Vascular: Pedal pulses are all palpable. Orthopedic: Contracted 2nd digits bilateral.  Hallux valgus with bunion on right. Elevated first ray left with dorsiflexed 2nd digit on top of the hallux. Neurologic: All epicritic and tactile sensations grossly intact.  Assessment: Painful porokeratotic lesion left ball. Deformed metatarsal with painful feet bilateral.  Treatment: Left porokeratotic lesion debrided.  40% Salicylic acid applied with aperture pad. Patient is to return in one week to have it removed under local anesthesia.  Patient will benefit from Orthotics for the abnormal metatarsal bones.  Pain medication reordered as per request.

## 2012-12-11 ENCOUNTER — Ambulatory Visit (INDEPENDENT_AMBULATORY_CARE_PROVIDER_SITE_OTHER): Payer: BC Managed Care – PPO | Admitting: Podiatry

## 2012-12-11 ENCOUNTER — Encounter: Payer: Self-pay | Admitting: Podiatry

## 2012-12-11 DIAGNOSIS — L851 Acquired keratosis [keratoderma] palmaris et plantaris: Secondary | ICD-10-CM

## 2012-12-11 DIAGNOSIS — M25572 Pain in left ankle and joints of left foot: Secondary | ICD-10-CM

## 2012-12-11 DIAGNOSIS — M25579 Pain in unspecified ankle and joints of unspecified foot: Secondary | ICD-10-CM

## 2012-12-11 MED ORDER — HYDROCODONE-ACETAMINOPHEN 5-325 MG PO TABS: 1.0000 | ORAL_TABLET | Freq: Four times a day (QID) | ORAL | Status: DC | PRN

## 2012-12-11 NOTE — Progress Notes (Signed)
52 year old male presents to have a lesion removed from ball of left foot. He was in last week and wanted to have the lesion removed permanently. The high risk of recurrence rate was explained. Patient came in for the surgery.  Procedure done: Left ball just proximal to the porokeratotic lesion, 0.5cm was injected with 3 ml of 1% Xylocaine with Epinephrine. Left foot was cleansed with Betadine solution. The lesion was outlined along the outer border healthy normal skin. The center lesion was removed with #15 blade.  The remaining void area, o.8cm  was resulted through the procedure. Compression bandage with Amerigel ointment applied. Return in 3 days to have dressing change.

## 2012-12-14 ENCOUNTER — Ambulatory Visit (INDEPENDENT_AMBULATORY_CARE_PROVIDER_SITE_OTHER): Payer: BC Managed Care – PPO | Admitting: Podiatry

## 2012-12-14 DIAGNOSIS — L851 Acquired keratosis [keratoderma] palmaris et plantaris: Secondary | ICD-10-CM

## 2012-12-14 MED ORDER — HYDROCODONE-ACETAMINOPHEN 5-325 MG PO TABS: 1.0000 | ORAL_TABLET | Freq: Four times a day (QID) | ORAL | Status: DC | PRN

## 2012-12-14 NOTE — Progress Notes (Signed)
3 days post op. Bandage is saturated with blood.  Surgical wound is clean and healing well. No active bleeding noted. Had a couple of raw spots on dorsum left foot from scratching.  Area cleansed with Iodine and reapplied sterile dressing. Patient is to change dressing daily till complete healing. Return as needed.

## 2012-12-28 ENCOUNTER — Ambulatory Visit (INDEPENDENT_AMBULATORY_CARE_PROVIDER_SITE_OTHER): Payer: BC Managed Care – PPO | Admitting: Podiatry

## 2012-12-28 DIAGNOSIS — L851 Acquired keratosis [keratoderma] palmaris et plantaris: Secondary | ICD-10-CM

## 2012-12-28 MED ORDER — OXYCODONE-ACETAMINOPHEN 7.5-325 MG PO TABS: 1.0000 | ORAL_TABLET | Freq: Three times a day (TID) | ORAL | Status: DC | PRN

## 2012-12-28 MED ORDER — CEPHALEXIN 500 MG PO CAPS: 500.0000 mg | ORAL_CAPSULE | Freq: Three times a day (TID) | ORAL | Status: DC

## 2012-12-28 NOTE — Progress Notes (Signed)
Status post excision porokeratosis under 2nd MPJ left foot. Stated that the foot has been very painful and seemed to have some swelling. Stated that he had to take Percocet 7.5/324 every 4 hours because of so much pain.  Objective: Area is covered with hard callus. Upon removal of outer layer, round 0.5cm moist wound was exposed inside circular ring of callus.  Assessment: Inflamed surgical wound left under about neck area of 2nd MPJ.   Plan: Area debrided, betadine cleansing done. Applied Aperture pad. Home care instruction with extra aperture pad dispensed. Rx. Keflex and refill on pain medication. Patient is to reduce pain medication 3 times a day(one every 8 hours).

## 2013-01-09 ENCOUNTER — Encounter: Payer: Self-pay | Admitting: Podiatry

## 2013-01-09 ENCOUNTER — Ambulatory Visit (INDEPENDENT_AMBULATORY_CARE_PROVIDER_SITE_OTHER): Payer: BC Managed Care – PPO | Admitting: Podiatry

## 2013-01-09 VITALS — Ht 72.0 in | Wt 232.0 lb

## 2013-01-09 DIAGNOSIS — L851 Acquired keratosis [keratoderma] palmaris et plantaris: Secondary | ICD-10-CM

## 2013-01-09 NOTE — Progress Notes (Signed)
Follow up on left foot lesion. Area is drying out and healing without infection. Excess keratotic tissue debrided. Amerigel ointment dressing applied. Will check again next week.

## 2013-01-09 NOTE — Patient Instructions (Addendum)
Follow up visit for the left foot wound. Base is healing without infection.  Left lateral swelling will reduce once walking normal.  Return in one week.

## 2013-01-11 ENCOUNTER — Ambulatory Visit: Payer: BC Managed Care – PPO | Admitting: Physician Assistant

## 2013-01-11 DIAGNOSIS — Z0289 Encounter for other administrative examinations: Secondary | ICD-10-CM

## 2013-01-14 ENCOUNTER — Ambulatory Visit: Payer: BC Managed Care – PPO | Admitting: Physician Assistant

## 2013-01-15 ENCOUNTER — Ambulatory Visit (INDEPENDENT_AMBULATORY_CARE_PROVIDER_SITE_OTHER): Payer: BC Managed Care – PPO | Admitting: Podiatry

## 2013-01-15 ENCOUNTER — Encounter: Payer: Self-pay | Admitting: Podiatry

## 2013-01-15 DIAGNOSIS — M7742 Metatarsalgia, left foot: Secondary | ICD-10-CM

## 2013-01-15 DIAGNOSIS — M25579 Pain in unspecified ankle and joints of unspecified foot: Secondary | ICD-10-CM

## 2013-01-15 DIAGNOSIS — M25572 Pain in left ankle and joints of left foot: Secondary | ICD-10-CM

## 2013-01-15 DIAGNOSIS — M7750 Other enthesopathy of unspecified foot: Secondary | ICD-10-CM

## 2013-01-15 DIAGNOSIS — M21969 Unspecified acquired deformity of unspecified lower leg: Secondary | ICD-10-CM

## 2013-01-15 DIAGNOSIS — M21962 Unspecified acquired deformity of left lower leg: Secondary | ICD-10-CM

## 2013-01-15 MED ORDER — OXYCODONE-ACETAMINOPHEN 7.5-325 MG PO TABS: 1.0000 | ORAL_TABLET | Freq: Three times a day (TID) | ORAL | Status: DC | PRN

## 2013-01-15 NOTE — Patient Instructions (Addendum)
Seen for follow up on left foot incision. Incision site is well healed. Both feet are casted for orthotics.  Pre-op reviewed for Cotton osteotomy with graft left, Hammer toe repair with capsulotomy 2nd left, and Aiken osteotomy left.

## 2013-01-15 NOTE — Progress Notes (Signed)
Subjective: Stated that the left foot hurt, the whole foot. Patient is seeking surgical intervention to reduce foot pain. Explained the surgery has no guarantee of success. Plantar lesion on left foot found to be healed off without abnormal callus formation.  Objective: Forefoot varus with elevated first ray left.  Long 2nd digit with dorsal contracture at the 2nd MPJ left. Valgus deviated hallux under ridding the 2nd digit left. Pain at the 2nd and 3rd MPJ area left foot. Pain from overlapping first and 2nd digit left.  Assessment: Lesser metatarsalgia with elevated first ray left. Hallux valgus with overlapping 2nd digit. 2nd MPJ dorsal contracture left. Old X-rays reviewed. Has had Austin bunionectomy with 2nd Metatarsal osteotomy left with screw fixation.   Plan:  No guarantee of success from the surgery given to patient. Will try Orthotics to relieve pressure from the 2nd and 3rd MPJ left.  May benefit from Cotton osteotomy with graft, Akin osteotomy to straighten the hallux, and shorten the 2nd digit with MPJ capsulotomy left foot.  Reviewed the consent form.

## 2013-01-16 ENCOUNTER — Ambulatory Visit: Payer: BC Managed Care – PPO | Admitting: Podiatry

## 2013-01-22 ENCOUNTER — Ambulatory Visit (INDEPENDENT_AMBULATORY_CARE_PROVIDER_SITE_OTHER): Payer: BC Managed Care – PPO | Admitting: Podiatry

## 2013-01-22 ENCOUNTER — Encounter: Payer: Self-pay | Admitting: Podiatry

## 2013-01-22 DIAGNOSIS — M7742 Metatarsalgia, left foot: Secondary | ICD-10-CM

## 2013-01-22 DIAGNOSIS — M7750 Other enthesopathy of unspecified foot: Secondary | ICD-10-CM

## 2013-01-22 NOTE — Progress Notes (Signed)
Patient presents stating that he was in pain and swelling on foot and could not work. He is scheduled for foot surgery on 01/24/13 and wanted to be out of work till then. Excuse note written and sent off to his work.

## 2013-01-22 NOTE — Patient Instructions (Addendum)
Discussed about up coming surgery, which is scheduled for 01/24/13.

## 2013-01-24 DIAGNOSIS — M205X9 Other deformities of toe(s) (acquired), unspecified foot: Secondary | ICD-10-CM

## 2013-01-24 DIAGNOSIS — Q664 Congenital talipes calcaneovalgus, unspecified foot: Secondary | ICD-10-CM

## 2013-01-24 HISTORY — PX: AIKEN OSTEOTOMY: SHX6331

## 2013-01-24 HISTORY — PX: OTHER SURGICAL HISTORY: SHX169

## 2013-01-29 ENCOUNTER — Ambulatory Visit (INDEPENDENT_AMBULATORY_CARE_PROVIDER_SITE_OTHER): Payer: BC Managed Care – PPO | Admitting: Podiatry

## 2013-01-29 ENCOUNTER — Encounter: Payer: Self-pay | Admitting: Podiatry

## 2013-01-29 VITALS — BP 160/96 | HR 85 | Ht 72.0 in | Wt 237.0 lb

## 2013-01-29 DIAGNOSIS — M25572 Pain in left ankle and joints of left foot: Secondary | ICD-10-CM

## 2013-01-29 DIAGNOSIS — M25579 Pain in unspecified ankle and joints of unspecified foot: Secondary | ICD-10-CM

## 2013-01-29 DIAGNOSIS — M7750 Other enthesopathy of unspecified foot: Secondary | ICD-10-CM

## 2013-01-29 DIAGNOSIS — M7742 Metatarsalgia, left foot: Secondary | ICD-10-CM

## 2013-01-29 NOTE — Progress Notes (Signed)
First post op visit. Came in under cast with crutches. Having some pain but manageable.  Denies fever or chill. No calf pain.   Objective:  Dressing inside cast has dry blood stained gauze over the first great toe. All digits show normal color. No abnormal edema noted.  Assessment: Normal post op progress.   Plan: Will change cast sooner than usual.

## 2013-01-29 NOTE — Patient Instructions (Addendum)
First post op visit. Continue to ambulate with use of crutches. Rest and elevate the limb as much as possible. Return in 2 weeks.

## 2013-02-02 ENCOUNTER — Encounter (HOSPITAL_BASED_OUTPATIENT_CLINIC_OR_DEPARTMENT_OTHER): Payer: Self-pay | Admitting: Emergency Medicine

## 2013-02-02 ENCOUNTER — Emergency Department (HOSPITAL_BASED_OUTPATIENT_CLINIC_OR_DEPARTMENT_OTHER)
Admission: EM | Admit: 2013-02-02 | Discharge: 2013-02-02 | Disposition: A | Discharge status: Home / Self Care | Payer: BC Managed Care – PPO | Attending: Emergency Medicine | Admitting: Emergency Medicine

## 2013-02-02 ENCOUNTER — Emergency Department (HOSPITAL_BASED_OUTPATIENT_CLINIC_OR_DEPARTMENT_OTHER): Payer: BC Managed Care – PPO

## 2013-02-02 DIAGNOSIS — G43909 Migraine, unspecified, not intractable, without status migrainosus: Secondary | ICD-10-CM | POA: Insufficient documentation

## 2013-02-02 DIAGNOSIS — S9032XA Contusion of left foot, initial encounter: Secondary | ICD-10-CM

## 2013-02-02 DIAGNOSIS — Z8719 Personal history of other diseases of the digestive system: Secondary | ICD-10-CM | POA: Insufficient documentation

## 2013-02-02 DIAGNOSIS — Z87828 Personal history of other (healed) physical injury and trauma: Secondary | ICD-10-CM | POA: Insufficient documentation

## 2013-02-02 DIAGNOSIS — Y929 Unspecified place or not applicable: Secondary | ICD-10-CM | POA: Insufficient documentation

## 2013-02-02 DIAGNOSIS — Z79899 Other long term (current) drug therapy: Secondary | ICD-10-CM | POA: Insufficient documentation

## 2013-02-02 DIAGNOSIS — Z86718 Personal history of other venous thrombosis and embolism: Secondary | ICD-10-CM | POA: Insufficient documentation

## 2013-02-02 DIAGNOSIS — Z792 Long term (current) use of antibiotics: Secondary | ICD-10-CM | POA: Insufficient documentation

## 2013-02-02 DIAGNOSIS — S9030XA Contusion of unspecified foot, initial encounter: Secondary | ICD-10-CM | POA: Insufficient documentation

## 2013-02-02 DIAGNOSIS — Z862 Personal history of diseases of the blood and blood-forming organs and certain disorders involving the immune mechanism: Secondary | ICD-10-CM | POA: Insufficient documentation

## 2013-02-02 DIAGNOSIS — Y9389 Activity, other specified: Secondary | ICD-10-CM | POA: Insufficient documentation

## 2013-02-02 DIAGNOSIS — Z8659 Personal history of other mental and behavioral disorders: Secondary | ICD-10-CM | POA: Insufficient documentation

## 2013-02-02 DIAGNOSIS — IMO0002 Reserved for concepts with insufficient information to code with codable children: Secondary | ICD-10-CM | POA: Insufficient documentation

## 2013-02-02 DIAGNOSIS — Z87442 Personal history of urinary calculi: Secondary | ICD-10-CM | POA: Insufficient documentation

## 2013-02-02 DIAGNOSIS — I10 Essential (primary) hypertension: Secondary | ICD-10-CM | POA: Insufficient documentation

## 2013-02-02 DIAGNOSIS — Z8639 Personal history of other endocrine, nutritional and metabolic disease: Secondary | ICD-10-CM | POA: Insufficient documentation

## 2013-02-02 MED ORDER — HYDROMORPHONE HCL PF 2 MG/ML IJ SOLN
2.0000 mg | Freq: Once | INTRAMUSCULAR | Status: AC
  Administered 2013-02-02: 2 mg via INTRAMUSCULAR
  Filled 2013-02-02: qty 1

## 2013-02-02 MED ORDER — HYDROCODONE-ACETAMINOPHEN 7.5-325 MG PO TABS: 1.0000 | ORAL_TABLET | Freq: Four times a day (QID) | ORAL | Status: DC | PRN
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
## 2013-02-02 NOTE — ED Notes (Signed)
MD at bedside. 

## 2013-02-02 NOTE — ED Notes (Signed)
Patient had foot surgery on Thursday and today while getting out of the shower he hit his foot and now it has been hurting ever since.  Patient left foot in a cast.

## 2013-02-02 NOTE — ED Notes (Signed)
Patient transported to X-ray via stretcher per tech. 

## 2013-02-04 ENCOUNTER — Other Ambulatory Visit: Payer: Self-pay | Admitting: Podiatry

## 2013-02-04 MED ORDER — HYDROCODONE-ACETAMINOPHEN 7.5-325 MG PO TABS: 1.0000 | ORAL_TABLET | Freq: Four times a day (QID) | ORAL | Status: DC | PRN

## 2013-02-08 ENCOUNTER — Ambulatory Visit (INDEPENDENT_AMBULATORY_CARE_PROVIDER_SITE_OTHER): Payer: BC Managed Care – PPO | Admitting: Podiatry

## 2013-02-08 ENCOUNTER — Encounter: Payer: Self-pay | Admitting: Podiatry

## 2013-02-08 DIAGNOSIS — M21962 Unspecified acquired deformity of left lower leg: Secondary | ICD-10-CM

## 2013-02-08 DIAGNOSIS — M25579 Pain in unspecified ankle and joints of unspecified foot: Secondary | ICD-10-CM

## 2013-02-08 DIAGNOSIS — M21969 Unspecified acquired deformity of unspecified lower leg: Secondary | ICD-10-CM

## 2013-02-08 DIAGNOSIS — Z9889 Other specified postprocedural states: Secondary | ICD-10-CM

## 2013-02-08 DIAGNOSIS — M25572 Pain in left ankle and joints of left foot: Secondary | ICD-10-CM

## 2013-02-08 MED ORDER — OXYCODONE-ACETAMINOPHEN 5-325 MG PO TABS: 1.0000 | ORAL_TABLET | Freq: Four times a day (QID) | ORAL | Status: DC | PRN

## 2013-02-08 NOTE — Progress Notes (Signed)
Patient presents stating the cast was tight and the foot is swollen. Stated that he used a coat hanger and scratched the casted leg. He had asked for pain medication before his appointment time.   Objective: Removed cast. Left foot wound examined and noted of well coapting surgical incision. Positive of mild ankle edema. Positive of skin abrasion (3x10cm) at anterior shin from scratching.and replaced. Right foot X-ray taken and noted of having normal post op X-ray. Grafted bone is in place. Hallux Akin procedure is well maintained it corrected position with cerclage wire.  Assessment: Normal post op foot with mild rearfoot edema left. New self inflicted skin abrasion on anterior shin left leg.  Plan: All wound cleansed with Iodine. Anterior shin cleansed with H2O2, Betadine and applied Amerigel ointment dressing. Left leg placed in new fiber glass cast. Return in 3 weeks or sooner as needed.

## 2013-02-12 ENCOUNTER — Ambulatory Visit: Payer: BC Managed Care – PPO | Admitting: Podiatry

## 2013-02-22 ENCOUNTER — Ambulatory Visit (INDEPENDENT_AMBULATORY_CARE_PROVIDER_SITE_OTHER): Payer: BC Managed Care – PPO | Admitting: Podiatry

## 2013-02-22 ENCOUNTER — Encounter: Payer: Self-pay | Admitting: Podiatry

## 2013-02-22 DIAGNOSIS — M25579 Pain in unspecified ankle and joints of unspecified foot: Secondary | ICD-10-CM

## 2013-02-22 DIAGNOSIS — M7742 Metatarsalgia, left foot: Secondary | ICD-10-CM

## 2013-02-22 DIAGNOSIS — Z9889 Other specified postprocedural states: Secondary | ICD-10-CM

## 2013-02-22 DIAGNOSIS — M7750 Other enthesopathy of unspecified foot: Secondary | ICD-10-CM

## 2013-02-22 DIAGNOSIS — M25572 Pain in left ankle and joints of left foot: Secondary | ICD-10-CM

## 2013-02-22 MED ORDER — OXYCODONE-ACETAMINOPHEN 5-325 MG PO TABS: 1.0000 | ORAL_TABLET | Freq: Four times a day (QID) | ORAL | Status: DC | PRN

## 2013-02-22 NOTE — Progress Notes (Signed)
4 weeks following Cotton and Keller left foot. Patient came in with cast on left lower limb. Cast removed. Wound inspected and noted of no abnormal findings. Wound edges are well approximated. No visible edema noted.  Left foot and leg was washed and replaced in new cast.  Pain medication renewed.

## 2013-02-22 NOTE — Patient Instructions (Signed)
Post op visit. Findings are within normal without any complication. Bone heeling good. Old cast replaced with new. Return in 2 weeks to change to CAM walker. Continue with light ambulation.

## 2013-02-26 ENCOUNTER — Encounter: Payer: BC Managed Care – PPO | Admitting: Podiatry

## 2013-03-01 ENCOUNTER — Encounter: Payer: BC Managed Care – PPO | Admitting: Podiatry

## 2013-03-08 ENCOUNTER — Encounter: Payer: Self-pay | Admitting: Podiatry

## 2013-03-08 ENCOUNTER — Ambulatory Visit (INDEPENDENT_AMBULATORY_CARE_PROVIDER_SITE_OTHER): Payer: BC Managed Care – PPO | Admitting: Podiatry

## 2013-03-08 VITALS — BP 142/83 | HR 59

## 2013-03-08 DIAGNOSIS — M21969 Unspecified acquired deformity of unspecified lower leg: Secondary | ICD-10-CM

## 2013-03-08 DIAGNOSIS — M7750 Other enthesopathy of unspecified foot: Secondary | ICD-10-CM

## 2013-03-08 DIAGNOSIS — M7742 Metatarsalgia, left foot: Secondary | ICD-10-CM

## 2013-03-08 DIAGNOSIS — M21962 Unspecified acquired deformity of left lower leg: Secondary | ICD-10-CM

## 2013-03-08 MED ORDER — HYDROCODONE-ACETAMINOPHEN 5-325 MG PO TABS: 1.0000 | ORAL_TABLET | Freq: Four times a day (QID) | ORAL | Status: DC | PRN

## 2013-03-08 NOTE — Patient Instructions (Signed)
Cast removed. Toe area is still not healed. (Possible from recent injury) Use CAM walker and do not use or place weight on toe area. Return in 3 weeks.

## 2013-03-08 NOTE — Progress Notes (Signed)
5 weeks post op. He is doing well with minimum pain. He has an episode of hitting the left first and 2nd toes in a concrete while crossing a street. Had much pain for 3 days.   Objective:  Cast removed. Incision sites well healed. Great toe base has been rubbed by cast and skin has been braised. Post op X-rays taken. Bone graft is still in place. Great toe Akin procedure is still show osteotomy line.   Plan: Left great toe covered with Band aid and Amerigel cream. Use CAM walker. Do not use the great toe. Return in 3 weeks.

## 2013-03-20 ENCOUNTER — Ambulatory Visit (INDEPENDENT_AMBULATORY_CARE_PROVIDER_SITE_OTHER): Payer: BC Managed Care – PPO | Admitting: Podiatry

## 2013-03-20 ENCOUNTER — Encounter: Payer: Self-pay | Admitting: Podiatry

## 2013-03-20 VITALS — BP 139/92 | HR 62

## 2013-03-20 DIAGNOSIS — Z9889 Other specified postprocedural states: Secondary | ICD-10-CM

## 2013-03-20 MED ORDER — OXYCODONE-ACETAMINOPHEN 5-325 MG PO TABS: 1.0000 | ORAL_TABLET | Freq: Four times a day (QID) | ORAL | Status: DC | PRN

## 2013-03-20 NOTE — Patient Instructions (Addendum)
8 weeks post op. Wearing CAM walker. Feels much improvement.  No edema or erythema noted. Some discomfort with fore foot range of motion. Patient will start trying regular tennis shoes off and on, and gradually increase.  Return on Dec. 8. If ready, will keep the return work day of Dec. 9.

## 2013-03-20 NOTE — Progress Notes (Signed)
8 weeks post op. Wearing CAM walker. Feels much improvement.  No edema or erythema noted. Some discomfort with fore foot with range of motion. Patient will start trying regular tennis shoes.  Will see him in Dec. 8. If he is ready, he will be released to work on Dec. 9.

## 2013-03-29 ENCOUNTER — Encounter: Payer: BC Managed Care – PPO | Admitting: Podiatry

## 2013-04-02 ENCOUNTER — Encounter: Payer: Self-pay | Admitting: Podiatry

## 2013-04-02 ENCOUNTER — Ambulatory Visit (INDEPENDENT_AMBULATORY_CARE_PROVIDER_SITE_OTHER): Payer: BC Managed Care – PPO | Admitting: Podiatry

## 2013-04-02 VITALS — BP 151/96 | HR 70

## 2013-04-02 DIAGNOSIS — M25572 Pain in left ankle and joints of left foot: Secondary | ICD-10-CM

## 2013-04-02 DIAGNOSIS — M7742 Metatarsalgia, left foot: Secondary | ICD-10-CM

## 2013-04-02 DIAGNOSIS — M25579 Pain in unspecified ankle and joints of unspecified foot: Secondary | ICD-10-CM

## 2013-04-02 DIAGNOSIS — M21962 Unspecified acquired deformity of left lower leg: Secondary | ICD-10-CM

## 2013-04-02 DIAGNOSIS — M21969 Unspecified acquired deformity of unspecified lower leg: Secondary | ICD-10-CM

## 2013-04-02 DIAGNOSIS — M7750 Other enthesopathy of unspecified foot: Secondary | ICD-10-CM

## 2013-04-02 MED ORDER — OXYCODONE-ACETAMINOPHEN 7.5-325 MG PO TABS: 1.0000 | ORAL_TABLET | Freq: Four times a day (QID) | ORAL | Status: DC | PRN

## 2013-04-02 MED ORDER — INDOMETHACIN 50 MG PO CAPS: 50.0000 mg | ORAL_CAPSULE | Freq: Three times a day (TID) | ORAL | Status: DC

## 2013-04-02 NOTE — Progress Notes (Signed)
Subjective:  Post op. Status post 10 weeks following Cotton osteotomy with graft and Aiken osteotomy left foot. 52 year old male presents stating that he has had considerable pain on right bunion area and heel for a week (none surgical foot). He felt the pain like gout attack. He is able to wear regular tennis shoes on the left (surgical foot). Able to stand for about 3 hours at this time.  Hurts to put pressure or to bend the left foot.   Objective: Positive of hallux valgus with prominent bunion on right. Normal wound healing in progress on left foot and able to wear normal shoe gear with mild discomfort.  Assessment: Satisfactory wound healing.  Plan: Will do final check in 3 weeks. If all goes well, he will be release to return to work on 04/23/13.

## 2013-04-02 NOTE — Patient Instructions (Signed)
Seen for bilateral foot pain. May suffering from Gout.  Pain medication dose increased and added Indomethacin. Return in 3 weeks.

## 2013-04-08 ENCOUNTER — Emergency Department (HOSPITAL_BASED_OUTPATIENT_CLINIC_OR_DEPARTMENT_OTHER)
Admission: EM | Admit: 2013-04-08 | Discharge: 2013-04-08 | Disposition: A | Discharge status: Home / Self Care | Payer: BC Managed Care – PPO | Attending: Emergency Medicine | Admitting: Emergency Medicine

## 2013-04-08 ENCOUNTER — Encounter (HOSPITAL_BASED_OUTPATIENT_CLINIC_OR_DEPARTMENT_OTHER): Payer: Self-pay | Admitting: Emergency Medicine

## 2013-04-08 DIAGNOSIS — Z86718 Personal history of other venous thrombosis and embolism: Secondary | ICD-10-CM | POA: Insufficient documentation

## 2013-04-08 DIAGNOSIS — M109 Gout, unspecified: Secondary | ICD-10-CM | POA: Insufficient documentation

## 2013-04-08 DIAGNOSIS — Z8719 Personal history of other diseases of the digestive system: Secondary | ICD-10-CM | POA: Insufficient documentation

## 2013-04-08 DIAGNOSIS — Z87442 Personal history of urinary calculi: Secondary | ICD-10-CM | POA: Insufficient documentation

## 2013-04-08 DIAGNOSIS — Z8659 Personal history of other mental and behavioral disorders: Secondary | ICD-10-CM | POA: Insufficient documentation

## 2013-04-08 DIAGNOSIS — Z87828 Personal history of other (healed) physical injury and trauma: Secondary | ICD-10-CM | POA: Insufficient documentation

## 2013-04-08 DIAGNOSIS — G43909 Migraine, unspecified, not intractable, without status migrainosus: Secondary | ICD-10-CM | POA: Insufficient documentation

## 2013-04-08 DIAGNOSIS — Z79899 Other long term (current) drug therapy: Secondary | ICD-10-CM | POA: Insufficient documentation

## 2013-04-08 MED ORDER — OXYCODONE-ACETAMINOPHEN 5-325 MG PO TABS: 1.0000 | ORAL_TABLET | ORAL | Status: DC | PRN

## 2013-04-08 MED ORDER — OXYCODONE-ACETAMINOPHEN 5-325 MG PO TABS
1.0000 | ORAL_TABLET | Freq: Once | ORAL | Status: AC
  Administered 2013-04-08: 1 via ORAL
  Filled 2013-04-08: qty 1
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
## 2013-04-08 NOTE — ED Notes (Signed)
C/o left foot pain/swelling-started last night-hx of gout

## 2013-04-10 NOTE — ED Provider Notes (Signed)
Medical screening examination/treatment/procedure(s) were performed by non-physician practitioner and as supervising physician I was immediately available for consultation/collaboration.  EKG Interpretation   None        [name], MD 04/10/13 [phone]

## 2013-04-15 ENCOUNTER — Ambulatory Visit (INDEPENDENT_AMBULATORY_CARE_PROVIDER_SITE_OTHER): Payer: BC Managed Care – PPO | Admitting: Podiatry

## 2013-04-15 ENCOUNTER — Encounter: Payer: Self-pay | Admitting: Podiatry

## 2013-04-15 VITALS — BP 171/95 | HR 80

## 2013-04-15 DIAGNOSIS — M109 Gout, unspecified: Secondary | ICD-10-CM

## 2013-04-15 MED ORDER — FEBUXOSTAT 40 MG PO TABS: 40.0000 mg | ORAL_TABLET | Freq: Every day | ORAL | Status: DC

## 2013-04-15 MED ORDER — OXYCODONE-ACETAMINOPHEN 5-325 MG PO TABS: 1.0000 | ORAL_TABLET | Freq: Three times a day (TID) | ORAL | Status: DC | PRN

## 2013-04-15 NOTE — Patient Instructions (Signed)
Doing well on the post op wound on left foot.  Having Gout on right bunion area. New prescription for Gout given.

## 2013-04-15 NOTE — Progress Notes (Signed)
Subjective: 11 weeks 5 days status post left foot surgery, Cotton osteotomy and Aiken osteotomy. Doing well on left foot. Right foot is having gout on the first MPJ area.   Objective: Warm and pain on the first MPJ right. Well healed left post op wound.  Walking well in regular shoes without pain or edema left foot.  Assessment: Recovering well from post op care on left foot and ready to return to work. Need to monitor right foot gout problem.   Plan: Release to work. Monitor right foot gout.

## 2013-04-22 ENCOUNTER — Encounter: Payer: BC Managed Care – PPO | Admitting: Podiatry

## 2013-04-29 ENCOUNTER — Encounter: Payer: Self-pay | Admitting: Podiatry

## 2013-04-29 ENCOUNTER — Ambulatory Visit (INDEPENDENT_AMBULATORY_CARE_PROVIDER_SITE_OTHER): Payer: BC Managed Care – PPO | Admitting: Podiatry

## 2013-04-29 DIAGNOSIS — Z9889 Other specified postprocedural states: Secondary | ICD-10-CM

## 2013-04-29 MED ORDER — NABUMETONE 750 MG PO TABS: 750.0000 mg | ORAL_TABLET | Freq: Two times a day (BID) | ORAL | Status: DC

## 2013-04-29 MED ORDER — OXYCODONE-ACETAMINOPHEN 5-325 MG PO TABS: 1.0000 | ORAL_TABLET | Freq: Three times a day (TID) | ORAL | Status: DC | PRN

## 2013-04-29 NOTE — Patient Instructions (Signed)
Seen for pain in both feet.

## 2013-04-29 NOTE — Progress Notes (Signed)
Both feet hurting bad. Been working full time since last week.  Hard to stand on both feet. He is taking three pain pills a day to get by.   Objective: Minimum edema bilateral. Existing bunion and hallux valgus right. Status post Cotton osteotomy with Akin osteotomy left.  Satisfactory wound healing.  No abnormal findings in surgical sites.   Assessment: Fatigue pain from prolonged rest and abrupt change in physical activity.  Plan: Reviewed findings. Patient is to take anti inflammatory medication and use pain medication if necessary.

## 2013-05-15 ENCOUNTER — Telehealth: Payer: Self-pay | Admitting: *Deleted

## 2013-05-15 MED ORDER — NABUMETONE 750 MG PO TABS: 750.0000 mg | ORAL_TABLET | Freq: Two times a day (BID) | ORAL | Status: DC

## 2013-05-15 MED ORDER — OXYCODONE-ACETAMINOPHEN 5-325 MG PO TABS: 1.0000 | ORAL_TABLET | Freq: Three times a day (TID) | ORAL | Status: DC | PRN

## 2013-05-15 NOTE — Telephone Encounter (Signed)
05/15/2013 Pt called this am and requested could Dr. Caffie Pinto write  A rx for inflammation and one for pain. Pt states he is out of work this week due to swelling and pain, his Insurance doesn't pick back up again until May 25 2013. He can pick up this afternoon.

## 2013-05-20 ENCOUNTER — Other Ambulatory Visit: Payer: Self-pay | Admitting: Podiatry

## 2013-05-20 MED ORDER — HYDROCODONE-ACETAMINOPHEN 5-325 MG PO TABS: 1.0000 | ORAL_TABLET | Freq: Two times a day (BID) | ORAL | Status: DC | PRN

## 2013-05-28 ENCOUNTER — Ambulatory Visit: Payer: Self-pay | Admitting: Podiatry

## 2013-06-03 ENCOUNTER — Ambulatory Visit (INDEPENDENT_AMBULATORY_CARE_PROVIDER_SITE_OTHER): Payer: Self-pay | Admitting: Podiatry

## 2013-06-03 ENCOUNTER — Encounter: Payer: Self-pay | Admitting: Podiatry

## 2013-06-03 VITALS — BP 180/104 | HR 81

## 2013-06-03 DIAGNOSIS — M25579 Pain in unspecified ankle and joints of unspecified foot: Secondary | ICD-10-CM

## 2013-06-03 DIAGNOSIS — M21962 Unspecified acquired deformity of left lower leg: Secondary | ICD-10-CM

## 2013-06-03 DIAGNOSIS — M7750 Other enthesopathy of unspecified foot: Secondary | ICD-10-CM

## 2013-06-03 DIAGNOSIS — M7742 Metatarsalgia, left foot: Secondary | ICD-10-CM

## 2013-06-03 DIAGNOSIS — M21969 Unspecified acquired deformity of unspecified lower leg: Secondary | ICD-10-CM

## 2013-06-03 MED ORDER — DICLOFENAC EPOLAMINE 1.3 % TD PTCH: 1.0000 | MEDICATED_PATCH | Freq: Two times a day (BID) | TRANSDERMAL | Status: DC

## 2013-06-03 MED ORDER — OXYCODONE-ACETAMINOPHEN 7.5-325 MG PO TABS: 1.0000 | ORAL_TABLET | Freq: Three times a day (TID) | ORAL | Status: DC | PRN

## 2013-06-03 NOTE — Patient Instructions (Signed)
Seen for left foot pain. Has swollen area at the base of the 5th Metatarsal.  May use Anti inflammatory patch over the painful site. Also can use CAM walker as needed.  Pain pills as needed.

## 2013-06-03 NOTE — Progress Notes (Signed)
This is 4 months status post Cotton osteotomy with bone graft and Aiken osteotomy left foot.  Works 10-12 hours on feet now. Stated that he has pain on left lateral column.  Objective: Has redness with localized edema at the base of 5th Metatarsal left foot.  Pain on left foot.  Assessment: Tenosynovitis 5th Metatarsal base left. Status post 4 months Cotton osteotomy with graft and Aiken left.  Plan: Reviewed findings. May benefit from Flector patch. Pain medication refill done as per request. Patient is to use CAM walker when needed.

## 2013-06-24 ENCOUNTER — Encounter: Payer: Self-pay | Admitting: Podiatry

## 2013-06-24 ENCOUNTER — Ambulatory Visit (INDEPENDENT_AMBULATORY_CARE_PROVIDER_SITE_OTHER): Payer: Self-pay | Admitting: Podiatry

## 2013-06-24 VITALS — BP 182/112 | HR 88

## 2013-06-24 DIAGNOSIS — M7750 Other enthesopathy of unspecified foot: Secondary | ICD-10-CM

## 2013-06-24 DIAGNOSIS — M21969 Unspecified acquired deformity of unspecified lower leg: Secondary | ICD-10-CM

## 2013-06-24 DIAGNOSIS — M21962 Unspecified acquired deformity of left lower leg: Secondary | ICD-10-CM

## 2013-06-24 DIAGNOSIS — M7742 Metatarsalgia, left foot: Secondary | ICD-10-CM

## 2013-06-24 MED ORDER — OXYCODONE-ACETAMINOPHEN 5-325 MG PO TABS: 1.0000 | ORAL_TABLET | Freq: Three times a day (TID) | ORAL | Status: DC | PRN

## 2013-06-24 NOTE — Patient Instructions (Signed)
Pain medication prescribed. Will try one pill (5/325) two times a day.

## 2013-06-24 NOTE — Progress Notes (Signed)
Working 10-12 hours a day and feet are hurting during the day. Seeking a desk work now. As for now need pain medication to be able to handle work load. No new problem. Pain is similar to the one he had prior to the surgery.  Stated that surgery helped a lot about 90% helped. But still has residual pain that need be coped with pain medication.  Plan: Advised to use pain medication one two times a day at this time so that he will come out of the pills. He will try two times a day. Return as needed.

## 2013-07-19 ENCOUNTER — Ambulatory Visit: Payer: Self-pay | Admitting: Podiatry

## 2013-07-21 ENCOUNTER — Emergency Department (HOSPITAL_BASED_OUTPATIENT_CLINIC_OR_DEPARTMENT_OTHER): Payer: BC Managed Care – PPO

## 2013-07-21 ENCOUNTER — Emergency Department (HOSPITAL_BASED_OUTPATIENT_CLINIC_OR_DEPARTMENT_OTHER)
Admission: EM | Admit: 2013-07-21 | Discharge: 2013-07-21 | Disposition: A | Discharge status: Home / Self Care | Payer: Self-pay | Attending: Emergency Medicine | Admitting: Emergency Medicine

## 2013-07-21 ENCOUNTER — Encounter (HOSPITAL_BASED_OUTPATIENT_CLINIC_OR_DEPARTMENT_OTHER): Payer: Self-pay | Admitting: Emergency Medicine

## 2013-07-21 DIAGNOSIS — R109 Unspecified abdominal pain: Secondary | ICD-10-CM

## 2013-07-21 DIAGNOSIS — G43909 Migraine, unspecified, not intractable, without status migrainosus: Secondary | ICD-10-CM | POA: Insufficient documentation

## 2013-07-21 DIAGNOSIS — Z86718 Personal history of other venous thrombosis and embolism: Secondary | ICD-10-CM | POA: Insufficient documentation

## 2013-07-21 DIAGNOSIS — Z79899 Other long term (current) drug therapy: Secondary | ICD-10-CM | POA: Insufficient documentation

## 2013-07-21 DIAGNOSIS — I10 Essential (primary) hypertension: Secondary | ICD-10-CM | POA: Insufficient documentation

## 2013-07-21 DIAGNOSIS — R1084 Generalized abdominal pain: Secondary | ICD-10-CM | POA: Insufficient documentation

## 2013-07-21 DIAGNOSIS — M109 Gout, unspecified: Secondary | ICD-10-CM | POA: Insufficient documentation

## 2013-07-21 DIAGNOSIS — F3289 Other specified depressive episodes: Secondary | ICD-10-CM | POA: Insufficient documentation

## 2013-07-21 DIAGNOSIS — Z87828 Personal history of other (healed) physical injury and trauma: Secondary | ICD-10-CM | POA: Insufficient documentation

## 2013-07-21 DIAGNOSIS — Z9889 Other specified postprocedural states: Secondary | ICD-10-CM | POA: Insufficient documentation

## 2013-07-21 DIAGNOSIS — Z8719 Personal history of other diseases of the digestive system: Secondary | ICD-10-CM | POA: Insufficient documentation

## 2013-07-21 DIAGNOSIS — F329 Major depressive disorder, single episode, unspecified: Secondary | ICD-10-CM | POA: Insufficient documentation

## 2013-07-21 DIAGNOSIS — Z87442 Personal history of urinary calculi: Secondary | ICD-10-CM | POA: Insufficient documentation

## 2013-07-21 DIAGNOSIS — Z791 Long term (current) use of non-steroidal anti-inflammatories (NSAID): Secondary | ICD-10-CM | POA: Insufficient documentation

## 2013-07-21 LAB — COMPREHENSIVE METABOLIC PANEL
ALBUMIN: 3.7 g/dL (ref 3.5–5.2)
ALT: 13 U/L (ref 0–53)
AST: 16 U/L (ref 0–37)
Alkaline Phosphatase: 87 U/L (ref 39–117)
BILIRUBIN TOTAL: 0.5 mg/dL (ref 0.3–1.2)
BUN: 21 mg/dL (ref 6–23)
CALCIUM: 9.3 mg/dL (ref 8.4–10.5)
CO2: 27 mEq/L (ref 19–32)
CREATININE: 1.7 mg/dL — AB (ref 0.50–1.35)
Chloride: 102 mEq/L (ref 96–112)
GFR calc Af Amer: 51 mL/min — ABNORMAL LOW (ref >90)
GFR calc non Af Amer: 44 mL/min — ABNORMAL LOW (ref >90)
Glucose, Bld: 97 mg/dL (ref 70–99)
Potassium: 3.5 mEq/L — ABNORMAL LOW (ref 3.7–5.3)
Sodium: 142 mEq/L (ref 137–147)
Total Protein: 7.7 g/dL (ref 6.0–8.3)

## 2013-07-21 LAB — CBC WITH DIFFERENTIAL/PLATELET
Basophils Absolute: 0 10*3/uL (ref 0.0–0.1)
Basophils Relative: 0 % (ref 0–1)
EOS ABS: 0.1 10*3/uL (ref 0.0–0.7)
EOS PCT: 1 % (ref 0–5)
HEMATOCRIT: 40.8 % (ref 39.0–52.0)
Hemoglobin: 13 g/dL (ref 13.0–17.0)
Lymphocytes Relative: 33 % (ref 12–46)
Lymphs Abs: 2.9 10*3/uL (ref 0.7–4.0)
MCH: 25 pg — AB (ref 26.0–34.0)
MCHC: 31.9 g/dL (ref 30.0–36.0)
MCV: 78.6 fL (ref 78.0–100.0)
MONOS PCT: 12 % (ref 3–12)
Monocytes Absolute: 1 10*3/uL (ref 0.1–1.0)
Neutro Abs: 4.7 10*3/uL (ref 1.7–7.7)
Neutrophils Relative %: 54 % (ref 43–77)
Platelets: 258 10*3/uL (ref 150–400)
RBC: 5.19 MIL/uL (ref 4.22–5.81)
RDW: 15.3 % (ref 11.5–15.5)
WBC: 8.6 10*3/uL (ref 4.0–10.5)

## 2013-07-21 LAB — LIPASE, BLOOD: LIPASE: 26 U/L (ref 11–59)

## 2013-07-21 LAB — TROPONIN I

## 2013-07-21 MED ORDER — CIPROFLOXACIN HCL 500 MG PO TABS: 500.0000 mg | ORAL_TABLET | Freq: Two times a day (BID) | ORAL | Status: DC

## 2013-07-21 MED ORDER — IOHEXOL 300 MG/ML  SOLN
80.0000 mL | Freq: Once | INTRAMUSCULAR | Status: AC | PRN
  Administered 2013-07-21: 80 mL via INTRAVENOUS

## 2013-07-21 MED ORDER — PROMETHAZINE HCL 25 MG PO TABS: 25.0000 mg | ORAL_TABLET | Freq: Four times a day (QID) | ORAL | Status: DC | PRN

## 2013-07-21 MED ORDER — ONDANSETRON HCL 4 MG/2ML IJ SOLN
4.0000 mg | Freq: Once | INTRAMUSCULAR | Status: AC
  Administered 2013-07-21: 4 mg via INTRAVENOUS
  Filled 2013-07-21: qty 2

## 2013-07-21 MED ORDER — SODIUM CHLORIDE 0.9 % IV SOLN
1000.0000 mL | Freq: Once | INTRAVENOUS | Status: AC
  Administered 2013-07-21: 1000 mL via INTRAVENOUS

## 2013-07-21 MED ORDER — MORPHINE SULFATE 4 MG/ML IJ SOLN
4.0000 mg | Freq: Once | INTRAMUSCULAR | Status: AC
  Administered 2013-07-21: 4 mg via INTRAVENOUS
  Filled 2013-07-21: qty 1

## 2013-07-21 MED ORDER — HYDROCODONE-ACETAMINOPHEN 5-325 MG PO TABS: 2.0000 | ORAL_TABLET | ORAL | Status: DC | PRN

## 2013-07-21 MED ORDER — METRONIDAZOLE 500 MG PO TABS: 500.0000 mg | ORAL_TABLET | Freq: Three times a day (TID) | ORAL | Status: DC

## 2013-07-21 MED ORDER — IOHEXOL 300 MG/ML  SOLN
50.0000 mL | Freq: Once | INTRAMUSCULAR | Status: AC | PRN
  Administered 2013-07-21: 50 mL via ORAL

## 2013-07-21 NOTE — Discharge Instructions (Signed)
Abdominal Pain, Adult Many things can cause abdominal pain. Usually, abdominal pain is not caused by a disease and will improve without treatment. It can often be observed and treated at home. Your health care provider will do a physical exam and possibly order blood tests and X-rays to help determine the seriousness of your pain. However, in many cases, more time must pass before a clear cause of the pain can be found. Before that point, your health care provider may not know if you need more testing or further treatment. HOME CARE INSTRUCTIONS  Monitor your abdominal pain for any changes. The following actions may help to alleviate any discomfort you are experiencing:  Only take over-the-counter or prescription medicines as directed by your health care provider.  Do not take laxatives unless directed to do so by your health care provider.  Try a clear liquid diet (broth, tea, or water) as directed by your health care provider. Slowly move to a bland diet as tolerated. SEEK MEDICAL CARE IF:  You have unexplained abdominal pain.  You have abdominal pain associated with nausea or diarrhea.  You have pain when you urinate or have a bowel movement.  You experience abdominal pain that wakes you in the night.  You have abdominal pain that is worsened or improved by eating food.  You have abdominal pain that is worsened with eating fatty foods. SEEK IMMEDIATE MEDICAL CARE IF:   Your pain does not go away within 2 hours.  You have a fever.  You keep throwing up (vomiting).  Your pain is felt only in portions of the abdomen, such as the right side or the left lower portion of the abdomen.  You pass bloody or black tarry stools. MAKE SURE YOU:  Understand these instructions.   Will watch your condition.   Will get help right away if you are not doing well or get worse.  Document Released: 01/19/2005 Document Revised: 01/30/2013 Document Reviewed: 12/19/2012 [facility] Patient  Information 2014 [place].  Abdominal Pain, Adult Many things can cause abdominal pain. Usually, abdominal pain is not caused by a disease and will improve without treatment. It can often be observed and treated at home. Your health care provider will do a physical exam and possibly order blood tests and X-rays to help determine the seriousness of your pain. However, in many cases, more time must pass before a clear cause of the pain can be found. Before that point, your health care provider may not know if you need more testing or further treatment. HOME CARE INSTRUCTIONS  Monitor your abdominal pain for any changes. The following actions may help to alleviate any discomfort you are experiencing:  Only take over-the-counter or prescription medicines as directed by your health care provider.  Do not take laxatives unless directed to do so by your health care provider.  Try a clear liquid diet (broth, tea, or water) as directed by your health care provider. Slowly move to a bland diet as tolerated. SEEK MEDICAL CARE IF:  You have unexplained abdominal pain.  You have abdominal pain associated with nausea or diarrhea.  You have pain when you urinate or have a bowel movement.  You experience abdominal pain that wakes you in the night.  You have abdominal pain that is worsened or improved by eating food.  You have abdominal pain that is worsened with eating fatty foods. SEEK IMMEDIATE MEDICAL CARE IF:   Your pain does not go away within 2 hours.  You have a  fever.  You keep throwing up (vomiting).  Your pain is felt only in portions of the abdomen, such as the right side or the left lower portion of the abdomen.  You pass bloody or black tarry stools. MAKE SURE YOU:  Understand these instructions.   Will watch your condition.   Will get help right away if you are not doing well or get worse.  Document Released: 01/19/2005 Document Revised: 01/30/2013 Document Reviewed:  12/19/2012 [facility] Patient Information 2014 [place].

## 2013-07-21 NOTE — ED Notes (Signed)
Patient here with upper abdominal pain and multiple episodes of vomiting since Friday. Denies diarrhea. Tried broth earlier today and vomited again soon after. Weakness and ongoing pain
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
## 2013-07-24 ENCOUNTER — Telehealth: Payer: Self-pay | Admitting: *Deleted

## 2013-07-24 NOTE — Telephone Encounter (Signed)
Pt called and requested Rx refill of Percocet 5-325 mg.

## 2013-08-02 ENCOUNTER — Ambulatory Visit (INDEPENDENT_AMBULATORY_CARE_PROVIDER_SITE_OTHER): Payer: Self-pay | Admitting: Podiatry

## 2013-08-02 ENCOUNTER — Encounter: Payer: Self-pay | Admitting: Podiatry

## 2013-08-02 DIAGNOSIS — M7750 Other enthesopathy of unspecified foot: Secondary | ICD-10-CM

## 2013-08-02 DIAGNOSIS — M7740 Metatarsalgia, unspecified foot: Secondary | ICD-10-CM

## 2013-08-02 MED ORDER — OXYCODONE-ACETAMINOPHEN 5-325 MG PO TABS: 1.0000 | ORAL_TABLET | Freq: Three times a day (TID) | ORAL | Status: DC | PRN

## 2013-08-02 NOTE — Patient Instructions (Signed)
Seen for painful feet. May need to stay off of feet more. Pain medication prescribed as per request. Return as needed.

## 2013-08-02 NOTE — Progress Notes (Signed)
Left foot keep swelling and pain. It has been 6 months since surgery. Still on feet for long hours at work. This time he has been out of work for about 2 weeks.  Stated that it has constant swelling and pain. Pain is at lateral aspect and forefoot area of the left foot.   Objective: No visible edema noted.  Pain at lateral border along the 5th ray left and at the base of MPJ area left foot.   Assessment: Lateral column pain, which was  addressed with further plantar flexing the first ray and still having residual pain. The painful condition still remain some degree.  Plan: Lesser metatarsalgia left foot.

## 2013-09-23 ENCOUNTER — Emergency Department (HOSPITAL_BASED_OUTPATIENT_CLINIC_OR_DEPARTMENT_OTHER)
Admission: EM | Admit: 2013-09-23 | Discharge: 2013-09-23 | Disposition: A | Discharge status: Home / Self Care | Payer: BC Managed Care – PPO | Attending: Emergency Medicine | Admitting: Emergency Medicine

## 2013-09-23 ENCOUNTER — Encounter (HOSPITAL_BASED_OUTPATIENT_CLINIC_OR_DEPARTMENT_OTHER): Payer: Self-pay | Admitting: Emergency Medicine

## 2013-09-23 DIAGNOSIS — Z791 Long term (current) use of non-steroidal anti-inflammatories (NSAID): Secondary | ICD-10-CM | POA: Insufficient documentation

## 2013-09-23 DIAGNOSIS — F329 Major depressive disorder, single episode, unspecified: Secondary | ICD-10-CM | POA: Insufficient documentation

## 2013-09-23 DIAGNOSIS — Z8719 Personal history of other diseases of the digestive system: Secondary | ICD-10-CM | POA: Insufficient documentation

## 2013-09-23 DIAGNOSIS — G8911 Acute pain due to trauma: Secondary | ICD-10-CM | POA: Insufficient documentation

## 2013-09-23 DIAGNOSIS — G43909 Migraine, unspecified, not intractable, without status migrainosus: Secondary | ICD-10-CM | POA: Insufficient documentation

## 2013-09-23 DIAGNOSIS — Z792 Long term (current) use of antibiotics: Secondary | ICD-10-CM | POA: Insufficient documentation

## 2013-09-23 DIAGNOSIS — F3289 Other specified depressive episodes: Secondary | ICD-10-CM | POA: Insufficient documentation

## 2013-09-23 DIAGNOSIS — S46009A Unspecified injury of muscle(s) and tendon(s) of the rotator cuff of unspecified shoulder, initial encounter: Secondary | ICD-10-CM

## 2013-09-23 DIAGNOSIS — Z86718 Personal history of other venous thrombosis and embolism: Secondary | ICD-10-CM | POA: Insufficient documentation

## 2013-09-23 DIAGNOSIS — Z79899 Other long term (current) drug therapy: Secondary | ICD-10-CM | POA: Insufficient documentation

## 2013-09-23 DIAGNOSIS — Z87442 Personal history of urinary calculi: Secondary | ICD-10-CM | POA: Insufficient documentation

## 2013-09-23 DIAGNOSIS — M109 Gout, unspecified: Secondary | ICD-10-CM | POA: Insufficient documentation

## 2013-09-23 DIAGNOSIS — M25519 Pain in unspecified shoulder: Secondary | ICD-10-CM | POA: Insufficient documentation

## 2013-09-23 MED ORDER — TRAMADOL HCL 50 MG PO TABS: 50.0000 mg | ORAL_TABLET | Freq: Four times a day (QID) | ORAL | Status: DC | PRN

## 2013-09-23 MED ORDER — MELOXICAM 15 MG PO TABS: 15.0000 mg | ORAL_TABLET | Freq: Every day | ORAL | Status: DC
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
## 2013-09-23 NOTE — ED Notes (Signed)
MD at bedside. 

## 2013-09-23 NOTE — ED Notes (Signed)
Pt reports pain in his (R) shoulder since January 03, 2013 at work.  Denise workmans comp, reports that his attorney will handle it.

## 2013-09-23 NOTE — Discharge Instructions (Signed)
Your history, and exam suggests a right rotator cuff injury.  You'll need an MRI to complete a thorough evaluation of this.  You've been given followup physician. Please cal physician make an outpatient appointment.  Avoid overhead lifting with the right arm. Avoid lifting over 5 pounds with the right arm.

## 2013-09-25 ENCOUNTER — Ambulatory Visit: Payer: Self-pay | Admitting: Family Medicine

## 2013-10-01 ENCOUNTER — Ambulatory Visit: Payer: BC Managed Care – PPO | Admitting: Family Medicine

## 2013-10-03 ENCOUNTER — Ambulatory Visit: Payer: BC Managed Care – PPO | Admitting: Family Medicine

## 2013-10-21 ENCOUNTER — Telehealth: Payer: Self-pay | Admitting: *Deleted

## 2013-10-21 MED ORDER — OXYCODONE-ACETAMINOPHEN 5-325 MG PO TABS: 1.0000 | ORAL_TABLET | Freq: Three times a day (TID) | ORAL | Status: DC | PRN

## 2013-10-21 NOTE — Telephone Encounter (Signed)
Pt called this am and requested an rx for pain, He will be getting Insurance in two weeks, 2nd week of July he says. Pt states he doesn't have a updated telephone number.

## 2013-11-08 ENCOUNTER — Ambulatory Visit: Payer: Self-pay | Admitting: Podiatry

## 2013-12-23 ENCOUNTER — Telehealth: Payer: Self-pay | Admitting: *Deleted

## 2013-12-23 MED ORDER — OXYCODONE-ACETAMINOPHEN 5-325 MG PO TABS: 1.0000 | ORAL_TABLET | Freq: Three times a day (TID) | ORAL | Status: DC | PRN

## 2013-12-23 NOTE — Telephone Encounter (Signed)
Dr. Caffie Pinto, Patient wantsto know if he can get a RX. His Insurance is not yet effective, may be another month. He sates he doesn't have cash to come in.

## 2014-02-01 ENCOUNTER — Emergency Department (HOSPITAL_BASED_OUTPATIENT_CLINIC_OR_DEPARTMENT_OTHER)
Admission: EM | Admit: 2014-02-01 | Discharge: 2014-02-01 | Disposition: A | Discharge status: Home / Self Care | Payer: BC Managed Care – PPO | Attending: Emergency Medicine | Admitting: Emergency Medicine

## 2014-02-01 ENCOUNTER — Emergency Department (HOSPITAL_BASED_OUTPATIENT_CLINIC_OR_DEPARTMENT_OTHER): Payer: BC Managed Care – PPO

## 2014-02-01 ENCOUNTER — Encounter (HOSPITAL_BASED_OUTPATIENT_CLINIC_OR_DEPARTMENT_OTHER): Payer: Self-pay | Admitting: Emergency Medicine

## 2014-02-01 DIAGNOSIS — M109 Gout, unspecified: Secondary | ICD-10-CM

## 2014-02-01 DIAGNOSIS — Z86718 Personal history of other venous thrombosis and embolism: Secondary | ICD-10-CM | POA: Insufficient documentation

## 2014-02-01 DIAGNOSIS — G43909 Migraine, unspecified, not intractable, without status migrainosus: Secondary | ICD-10-CM | POA: Insufficient documentation

## 2014-02-01 DIAGNOSIS — Z7952 Long term (current) use of systemic steroids: Secondary | ICD-10-CM | POA: Insufficient documentation

## 2014-02-01 DIAGNOSIS — Z791 Long term (current) use of non-steroidal anti-inflammatories (NSAID): Secondary | ICD-10-CM | POA: Insufficient documentation

## 2014-02-01 DIAGNOSIS — M10071 Idiopathic gout, right ankle and foot: Secondary | ICD-10-CM | POA: Insufficient documentation

## 2014-02-01 DIAGNOSIS — Z87828 Personal history of other (healed) physical injury and trauma: Secondary | ICD-10-CM | POA: Insufficient documentation

## 2014-02-01 DIAGNOSIS — F329 Major depressive disorder, single episode, unspecified: Secondary | ICD-10-CM | POA: Insufficient documentation

## 2014-02-01 DIAGNOSIS — Z79899 Other long term (current) drug therapy: Secondary | ICD-10-CM | POA: Insufficient documentation

## 2014-02-01 DIAGNOSIS — Z8719 Personal history of other diseases of the digestive system: Secondary | ICD-10-CM | POA: Insufficient documentation

## 2014-02-01 DIAGNOSIS — Z87442 Personal history of urinary calculi: Secondary | ICD-10-CM | POA: Insufficient documentation

## 2014-02-01 DIAGNOSIS — Z792 Long term (current) use of antibiotics: Secondary | ICD-10-CM | POA: Insufficient documentation

## 2014-02-01 MED ORDER — HYDROMORPHONE HCL 1 MG/ML IJ SOLN
2.0000 mg | Freq: Once | INTRAMUSCULAR | Status: AC
  Administered 2014-02-01: 2 mg via INTRAMUSCULAR
  Filled 2014-02-01: qty 2

## 2014-02-01 MED ORDER — PREDNISONE 50 MG PO TABS: 50.0000 mg | ORAL_TABLET | Freq: Every day | ORAL | Status: DC

## 2014-02-01 MED ORDER — PREDNISONE 50 MG PO TABS
60.0000 mg | ORAL_TABLET | Freq: Once | ORAL | Status: AC
  Administered 2014-02-01: 60 mg via ORAL
  Filled 2014-02-01 (×2): qty 1

## 2014-02-01 MED ORDER — OXYCODONE-ACETAMINOPHEN 5-325 MG PO TABS: 1.0000 | ORAL_TABLET | ORAL | Status: DC | PRN

## 2014-02-01 NOTE — ED Notes (Signed)
Thinks he is having another gout attack in right foot

## 2014-02-01 NOTE — Discharge Instructions (Signed)
Please return to the ER if your symptoms worsen; you have increased pain, fevers, chills, inability to keep any medications down, confusion. Otherwise see the outpatient doctor as requested.   Gout Gout is an inflammatory arthritis caused by a buildup of uric acid crystals in the joints. Uric acid is a chemical that is normally present in the blood. When the level of uric acid in the blood is too high it can form crystals that deposit in your joints and tissues. This causes joint redness, soreness, and swelling (inflammation). Repeat attacks are common. Over time, uric acid crystals can form into masses (tophi) near a joint, destroying bone and causing disfigurement. Gout is treatable and often preventable. CAUSES  The disease begins with elevated levels of uric acid in the blood. Uric acid is produced by your body when it breaks down a naturally found substance called purines. Certain foods you eat, such as meats and fish, contain high amounts of purines. Causes of an elevated uric acid level include:  Being passed down from parent to child (heredity).  Diseases that cause increased uric acid production (such as obesity, psoriasis, and certain cancers).  Excessive alcohol use.  Diet, especially diets rich in meat and seafood.  Medicines, including certain cancer-fighting medicines (chemotherapy), water pills (diuretics), and aspirin.  Chronic kidney disease. The kidneys are no longer able to remove uric acid well.  Problems with metabolism. Conditions strongly associated with gout include:  Obesity.  High blood pressure.  High cholesterol.  Diabetes. Not everyone with elevated uric acid levels gets gout. It is not understood why some people get gout and others do not. Surgery, joint injury, and eating too much of certain foods are some of the factors that can lead to gout attacks. SYMPTOMS   An attack of gout comes on quickly. It causes intense pain with redness, swelling, and  warmth in a joint.  Fever can occur.  Often, only one joint is involved. Certain joints are more commonly involved:  Base of the big toe.  Knee.  Ankle.  Wrist.  Finger. Without treatment, an attack usually goes away in a few days to weeks. Between attacks, you usually will not have symptoms, which is different from many other forms of arthritis. DIAGNOSIS  Your caregiver will suspect gout based on your symptoms and exam. In some cases, tests may be recommended. The tests may include:  Blood tests.  Urine tests.  X-rays.  Joint fluid exam. This exam requires a needle to remove fluid from the joint (arthrocentesis). Using a microscope, gout is confirmed when uric acid crystals are seen in the joint fluid. TREATMENT  There are two phases to gout treatment: treating the sudden onset (acute) attack and preventing attacks (prophylaxis).  Treatment of an Acute Attack.  Medicines are used. These include anti-inflammatory medicines or steroid medicines.  An injection of steroid medicine into the affected joint is sometimes necessary.  The painful joint is rested. Movement can worsen the arthritis.  You may use warm or cold treatments on painful joints, depending which works best for you.  Treatment to Prevent Attacks.  If you suffer from frequent gout attacks, your caregiver may advise preventive medicine. These medicines are started after the acute attack subsides. These medicines either help your kidneys eliminate uric acid from your body or decrease your uric acid production. You may need to stay on these medicines for a very long time.  The early phase of treatment with preventive medicine can be associated with an increase in  acute gout attacks. For this reason, during the first few months of treatment, your caregiver may also advise you to take medicines usually used for acute gout treatment. Be sure you understand your caregiver's directions. Your caregiver may make several  adjustments to your medicine dose before these medicines are effective.  Discuss dietary treatment with your caregiver or dietitian. Alcohol and drinks high in sugar and fructose and foods such as meat, poultry, and seafood can increase uric acid levels. Your caregiver or dietitian can advise you on drinks and foods that should be limited. HOME CARE INSTRUCTIONS   Do not take aspirin to relieve pain. This raises uric acid levels.  Only take over-the-counter or prescription medicines for pain, discomfort, or fever as directed by your caregiver.  Rest the joint as much as possible. When in bed, keep sheets and blankets off painful areas.  Keep the affected joint raised (elevated).  Apply warm or cold treatments to painful joints. Use of warm or cold treatments depends on which works best for you.  Use crutches if the painful joint is in your leg.  Drink enough fluids to keep your urine clear or pale yellow. This helps your body get rid of uric acid. Limit alcohol, sugary drinks, and fructose drinks.  Follow your dietary instructions. Pay careful attention to the amount of protein you eat. Your daily diet should emphasize fruits, vegetables, whole grains, and fat-free or low-fat milk products. Discuss the use of coffee, vitamin C, and cherries with your caregiver or dietitian. These may be helpful in lowering uric acid levels.  Maintain a healthy body weight. SEEK MEDICAL CARE IF:   You develop diarrhea, vomiting, or any side effects from medicines.  You do not feel better in 24 hours, or you are getting worse. SEEK IMMEDIATE MEDICAL CARE IF:   Your joint becomes suddenly more tender, and you have chills or a fever. MAKE SURE YOU:   Understand these instructions.  Will watch your condition.  Will get help right away if you are not doing well or get worse. Document Released: 04/08/2000 Document Revised: 08/26/2013 Document Reviewed: 11/23/2011 [facility] Patient Information 2015  [place], [place]. This information is not intended to replace advice given to you by your health care provider. Make sure you discuss any questions you have with your health care provider.  Low-Purine Diet Purines are compounds that affect the level of uric acid in your body. A low-purine diet is a diet that is low in purines. Eating a low-purine diet can prevent the level of uric acid in your body from getting too high and causing gout or kidney stones or both. WHAT DO I NEED TO KNOW ABOUT THIS DIET?  Choose low-purine foods. Examples of low-purine foods are listed in the next section.  Drink plenty of fluids, especially water. Fluids can help remove uric acid from your body. Try to drink 8-16 cups (1.9-3.8 L) a day.  Limit foods high in fat, especially saturated fat, as fat makes it harder for the body to get rid of uric acid. Foods high in saturated fat include pizza, cheese, ice cream, whole milk, fried foods, and gravies. Choose foods that are lower in fat and lean sources of protein. Use olive oil when cooking as it contains healthy fats that are not high in saturated fat.  Limit alcohol. Alcohol interferes with the elimination of uric acid from your body. If you are having a gout attack, avoid all alcohol.  Keep in mind that different people's bodies react  differently to different foods. You will probably learn over time which foods do or do not affect you. If you discover that a food tends to cause your gout to flare up, avoid eating that food. You can more freely enjoy foods that do not cause problems. If you have any questions about a food item, talk to your dietitian or health care provider. WHICH FOODS ARE LOW, MODERATE, AND HIGH IN PURINES? The following is a list of foods that are low, moderate, and high in purines. You can eat any amount of the foods that are low in purines. You may be able to have small amounts of foods that are moderate in purines. Ask your health care provider how much  of a food moderate in purines you can have. Avoid foods high in purines. Grains  Foods low in purines: Enriched white bread, pasta, rice, cake, cornbread, popcorn.  Foods moderate in purines: Whole-grain breads and cereals, wheat germ, bran, oatmeal. Uncooked oatmeal. Dry wheat bran or wheat germ.  Foods high in purines: Pancakes, [place] toast, biscuits, muffins. Vegetables  Foods low in purines: All vegetables, except those that are moderate in purines.  Foods moderate in purines: Asparagus, cauliflower, spinach, mushrooms, green peas. Fruits  All fruits are low in purines. Meats and other Protein Foods  Foods low in purines: Eggs, nuts, peanut butter.  Foods moderate in purines: 80-90% lean beef, lamb, veal, pork, poultry, fish, eggs, peanut butter, nuts. Crab, lobster, oysters, and shrimp. Cooked dried beans, peas, and lentils.  Foods high in purines: Anchovies, sardines, herring, mussels, tuna, codfish, scallops, trout, and haddock. Berniece Salines. Organ meats (such as liver or kidney). Tripe. Game meat. Goose. Sweetbreads. Dairy  All dairy foods are low in purines. Low-fat and fat-free dairy products are best because they are low in saturated fat. Beverages  Drinks low in purines: Water, carbonated beverages, tea, coffee, cocoa.  Drinks moderate in purines: Soft drinks and other drinks sweetened with high-fructose corn syrup. Juices. To find whether a food or drink is sweetened with high-fructose corn syrup, look at the ingredients list.  Drinks high in purines: Alcoholic beverages (such as beer). Condiments  Foods low in purines: Salt, herbs, olives, pickles, relishes, vinegar.  Foods moderate in purines: Butter, margarine, oils, mayonnaise. Fats and Oils  Foods low in purines: All types, except gravies and sauces made with meat.  Foods high in purines: Gravies and sauces made with meat. Other Foods  Foods low in purines: Sugars, sweets, gelatin. Cake. Soups made without  meat.  Foods moderate in purines: Meat-based or fish-based soups, broths, or bouillons. Foods and drinks sweetened with high-fructose corn syrup.  Foods high in purines: High-fat desserts (such as ice cream, cookies, cakes, pies, doughnuts, and chocolate). Contact your dietitian for more information on foods that are not listed here. Document Released: 08/06/2010 Document Revised: 04/16/2013 Document Reviewed: 03/18/2013 [facility] Patient Information 2015 [place], [place]. This information is not intended to replace advice given to you by your health care provider. Make sure you discuss any questions you have with your health care provider.
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
## 2014-02-12 ENCOUNTER — Telehealth: Payer: Self-pay | Admitting: *Deleted

## 2014-02-12 IMAGING — CR DG ABDOMEN ACUTE W/ 1V CHEST
3 series · 3 of 3 positions shown · non-contrast
Comparison: Chest x-ray 09/19/2012.  Abdominal radiograph of
01/01/2012.

CLINICAL DATA: Abdominal pain.  Evaluate for obstruction or free
air.

ACUTE ABDOMEN SERIES (ABDOMEN 2 VIEW & CHEST 1 VIEW)

[w chest pa]
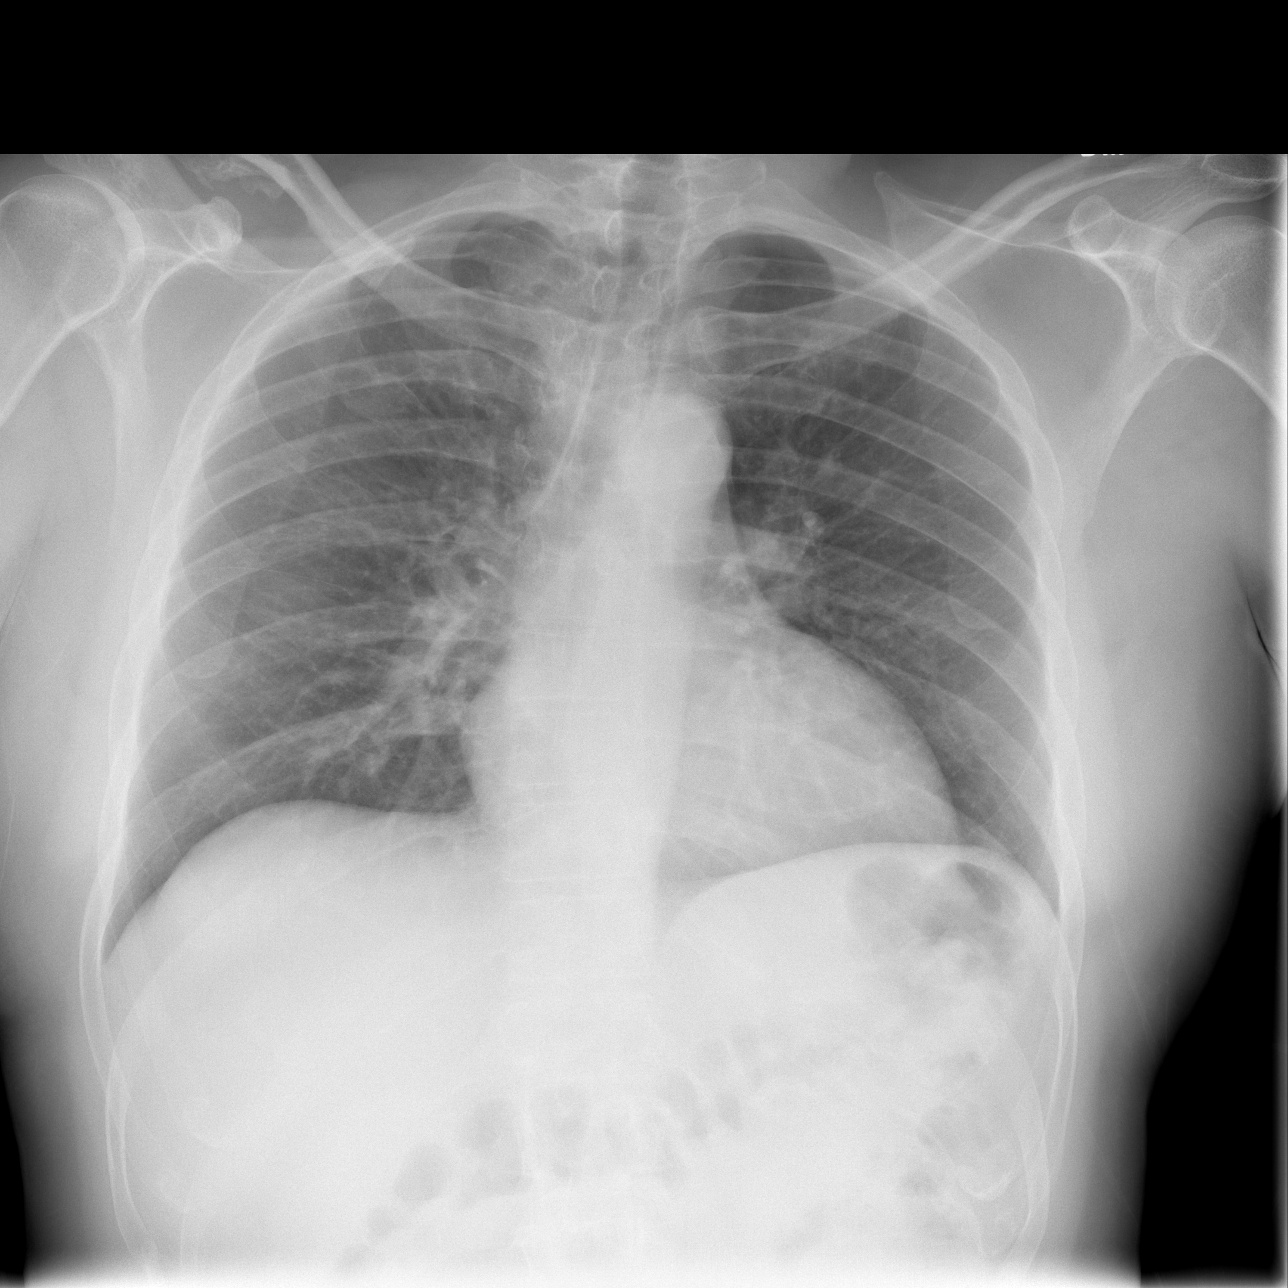

[w abdomen upright]
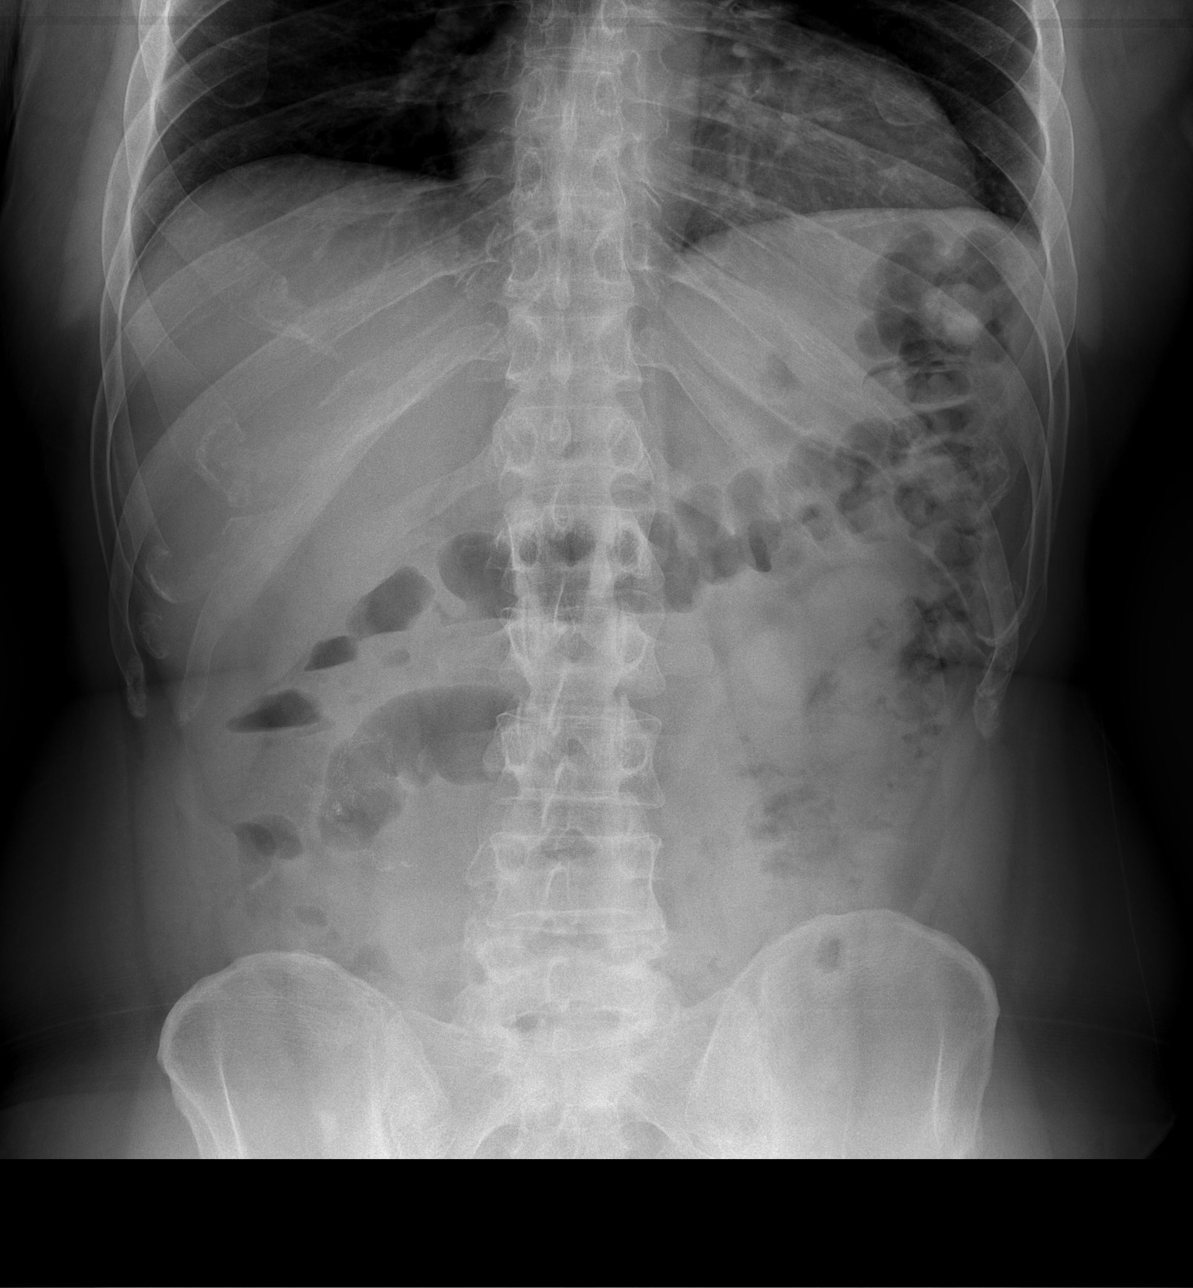

[t abdomen supine]
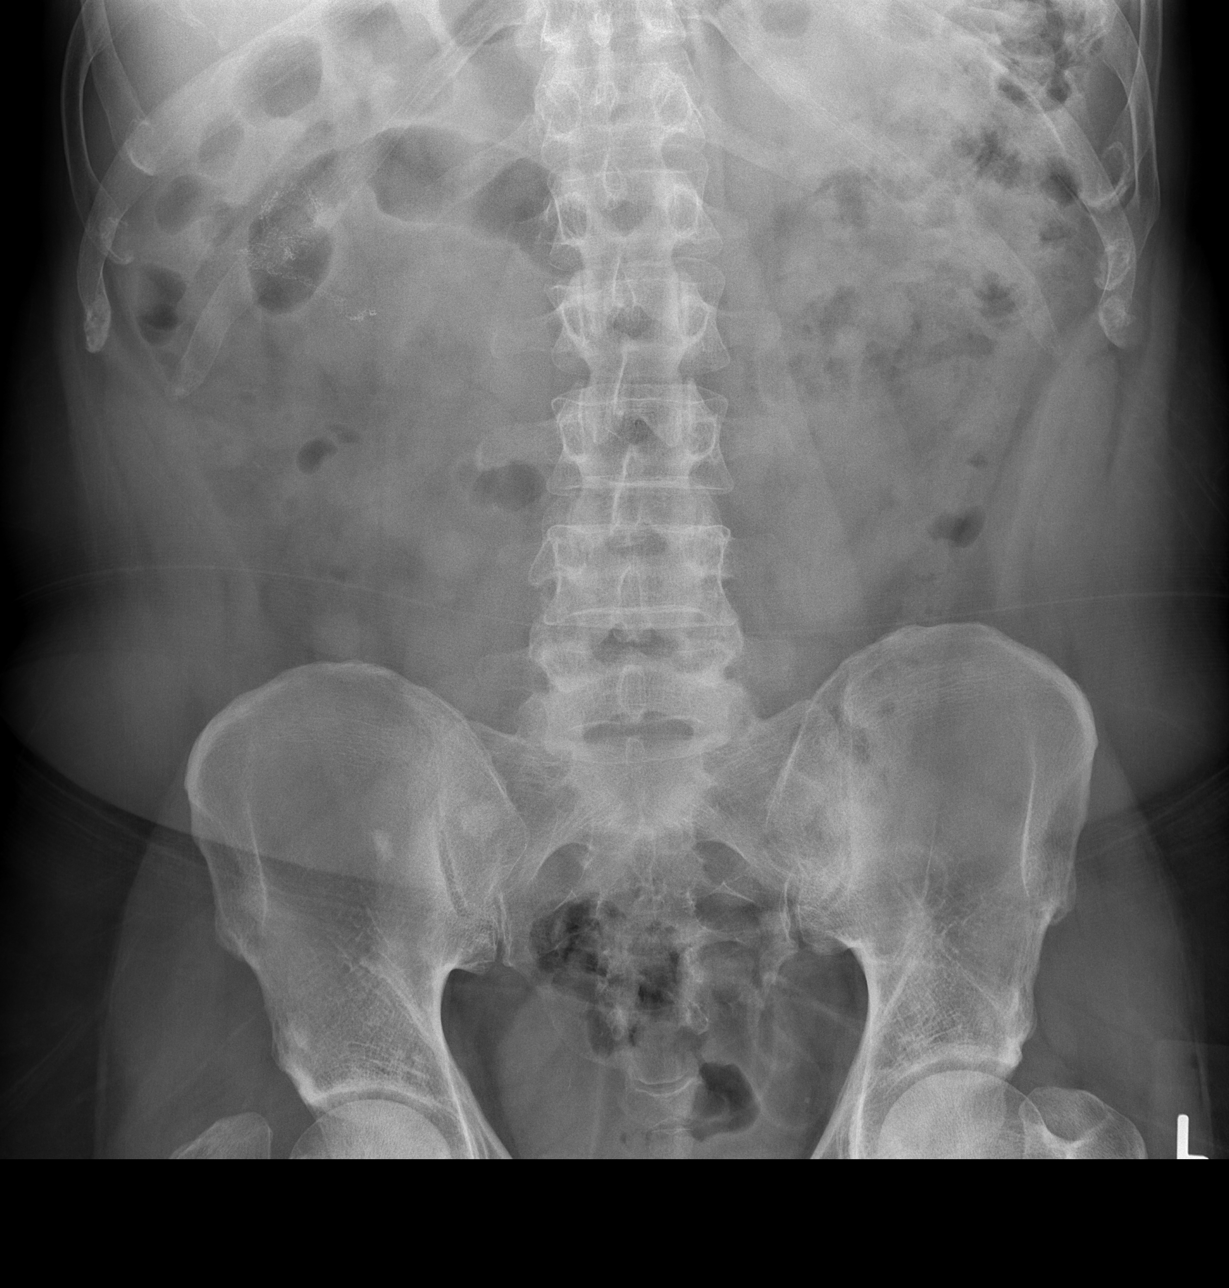

[3 of 3 positions shown; findings below may reference images not displayed]

FINDINGS: Lung volumes are normal.  No consolidative airspace
disease.  No pleural effusions.  No pneumothorax.  No pulmonary
nodule or mass noted.  Pulmonary vasculature and the
cardiomediastinal silhouette are within normal limits.

A suture line projecting over the right side of the abdomen.  A
single mildly dilated loop of gas-filled small bowel in the right
side of the abdomen measuring up to 3.3 cm in diameter in close
proximity to the suture line.  Gas and stool scattered throughout
the colon extending to the level of the distal rectum.  No
pneumoperitoneum.
IMPRESSION: 1.  Nonspecific bowel gas pattern, as above.  While findings could
be indicative of an early or partial small bowel obstruction, this
is not strongly favored.  The proximity of the mildly dilated small
bowel loop to the suture line in the right side of the abdomen may
suggest that this is simply a minimally dilated loop of bowel
adjacent to an anastomosis, which may be related to mild
denervation of a short segment of small bowel.  However, clinical
correlation is recommended.
2.  No pneumoperitoneum.
3.  No radiographic evidence of acute cardiopulmonary disease.

## 2014-02-12 MED ORDER — OXYCODONE-ACETAMINOPHEN 5-325 MG PO TABS: 1.0000 | ORAL_TABLET | Freq: Three times a day (TID) | ORAL | Status: DC | PRN

## 2014-02-12 NOTE — Telephone Encounter (Signed)
Dr. Caffie Pinto, Patient has called this afternoon, He has a Therapist, nutritional which isnt effective for 2 weeks he states, has no money but request a rx.

## 2014-03-13 ENCOUNTER — Encounter (HOSPITAL_BASED_OUTPATIENT_CLINIC_OR_DEPARTMENT_OTHER): Payer: Self-pay | Admitting: Emergency Medicine

## 2014-03-13 ENCOUNTER — Emergency Department (HOSPITAL_BASED_OUTPATIENT_CLINIC_OR_DEPARTMENT_OTHER): Payer: BC Managed Care – PPO

## 2014-03-13 ENCOUNTER — Emergency Department (HOSPITAL_BASED_OUTPATIENT_CLINIC_OR_DEPARTMENT_OTHER)
Admission: EM | Admit: 2014-03-13 | Discharge: 2014-03-13 | Disposition: A | Discharge status: Home / Self Care | Payer: BC Managed Care – PPO | Attending: Emergency Medicine | Admitting: Emergency Medicine

## 2014-03-13 DIAGNOSIS — M109 Gout, unspecified: Secondary | ICD-10-CM | POA: Diagnosis not present

## 2014-03-13 DIAGNOSIS — G43909 Migraine, unspecified, not intractable, without status migrainosus: Secondary | ICD-10-CM | POA: Diagnosis not present

## 2014-03-13 DIAGNOSIS — Z7952 Long term (current) use of systemic steroids: Secondary | ICD-10-CM | POA: Insufficient documentation

## 2014-03-13 DIAGNOSIS — Z87828 Personal history of other (healed) physical injury and trauma: Secondary | ICD-10-CM | POA: Insufficient documentation

## 2014-03-13 DIAGNOSIS — Y9389 Activity, other specified: Secondary | ICD-10-CM | POA: Insufficient documentation

## 2014-03-13 DIAGNOSIS — R030 Elevated blood-pressure reading, without diagnosis of hypertension: Secondary | ICD-10-CM

## 2014-03-13 DIAGNOSIS — F329 Major depressive disorder, single episode, unspecified: Secondary | ICD-10-CM | POA: Diagnosis not present

## 2014-03-13 DIAGNOSIS — Y9301 Activity, walking, marching and hiking: Secondary | ICD-10-CM | POA: Insufficient documentation

## 2014-03-13 DIAGNOSIS — W010XXA Fall on same level from slipping, tripping and stumbling without subsequent striking against object, initial encounter: Secondary | ICD-10-CM

## 2014-03-13 DIAGNOSIS — Z87442 Personal history of urinary calculi: Secondary | ICD-10-CM | POA: Diagnosis not present

## 2014-03-13 DIAGNOSIS — Z79899 Other long term (current) drug therapy: Secondary | ICD-10-CM | POA: Insufficient documentation

## 2014-03-13 DIAGNOSIS — Z86718 Personal history of other venous thrombosis and embolism: Secondary | ICD-10-CM | POA: Insufficient documentation

## 2014-03-13 DIAGNOSIS — I10 Essential (primary) hypertension: Secondary | ICD-10-CM | POA: Insufficient documentation

## 2014-03-13 DIAGNOSIS — W108XXA Fall (on) (from) other stairs and steps, initial encounter: Secondary | ICD-10-CM | POA: Insufficient documentation

## 2014-03-13 DIAGNOSIS — Z792 Long term (current) use of antibiotics: Secondary | ICD-10-CM | POA: Insufficient documentation

## 2014-03-13 DIAGNOSIS — Y9289 Other specified places as the place of occurrence of the external cause: Secondary | ICD-10-CM | POA: Insufficient documentation

## 2014-03-13 DIAGNOSIS — Z8719 Personal history of other diseases of the digestive system: Secondary | ICD-10-CM | POA: Insufficient documentation

## 2014-03-13 DIAGNOSIS — S8992XA Unspecified injury of left lower leg, initial encounter: Secondary | ICD-10-CM | POA: Diagnosis not present

## 2014-03-13 DIAGNOSIS — IMO0001 Reserved for inherently not codable concepts without codable children: Secondary | ICD-10-CM

## 2014-03-13 DIAGNOSIS — Y998 Other external cause status: Secondary | ICD-10-CM | POA: Diagnosis not present

## 2014-03-13 DIAGNOSIS — M25562 Pain in left knee: Secondary | ICD-10-CM

## 2014-03-13 DIAGNOSIS — Z791 Long term (current) use of non-steroidal anti-inflammatories (NSAID): Secondary | ICD-10-CM | POA: Diagnosis not present

## 2014-03-13 DIAGNOSIS — W19XXXA Unspecified fall, initial encounter: Secondary | ICD-10-CM

## 2014-03-13 MED ORDER — HYDROCODONE-ACETAMINOPHEN 5-325 MG PO TABS
2.0000 | ORAL_TABLET | Freq: Once | ORAL | Status: AC
  Administered 2014-03-13: 2 via ORAL
  Filled 2014-03-13: qty 2

## 2014-03-13 MED ORDER — HYDROCODONE-ACETAMINOPHEN 5-325 MG PO TABS: 1.0000 | ORAL_TABLET | ORAL | Status: DC | PRN

## 2014-03-13 MED ORDER — CLONIDINE HCL 0.1 MG PO TABS: 0.1000 mg | ORAL_TABLET | Freq: Two times a day (BID) | ORAL | Status: AC

## 2014-03-13 MED ORDER — CLONIDINE HCL 0.1 MG PO TABS
0.1000 mg | ORAL_TABLET | Freq: Once | ORAL | Status: AC
  Administered 2014-03-13: 0.1 mg via ORAL
  Filled 2014-03-13: qty 1

## 2014-03-13 NOTE — ED Notes (Signed)
Slipped walking down steps and twisted left knee ? Slight swelling   Ice applied to knee

## 2014-03-13 NOTE — ED Notes (Signed)
Pt states he slipped down some steps at work and it turned his knee.

## 2014-03-13 NOTE — Discharge Instructions (Signed)
Take Vicodin for severe pain only. No driving or operating heavy machinery while taking vicodin. This medication may cause drowsiness.  Knee Pain The knee is the complex joint between your thigh and your lower leg. It is made up of bones, tendons, ligaments, and cartilage. The bones that make up the knee are:  The femur in the thigh.  The tibia and fibula in the lower leg.  The patella or kneecap riding in the groove on the lower femur. CAUSES  Knee pain is a common complaint with many causes. A few of these causes are:  Injury, such as:  A ruptured ligament or tendon injury.  Torn cartilage.  Medical conditions, such as:  Gout  Arthritis  Infections  Overuse, over training, or overdoing a physical activity. Knee pain can be minor or severe. Knee pain can accompany debilitating injury. Minor knee problems often respond well to self-care measures or get well on their own. More serious injuries may need medical intervention or even surgery. SYMPTOMS The knee is complex. Symptoms of knee problems can vary widely. Some of the problems are:  Pain with movement and weight bearing.  Swelling and tenderness.  Buckling of the knee.  Inability to straighten or extend your knee.  Your knee locks and you cannot straighten it.  Warmth and redness with pain and fever.  Deformity or dislocation of the kneecap. DIAGNOSIS  Determining what is wrong may be very straight forward such as when there is an injury. It can also be challenging because of the complexity of the knee. Tests to make a diagnosis may include:  Your caregiver taking a history and doing a physical exam.  Routine X-rays can be used to rule out other problems. X-rays will not reveal a cartilage tear. Some injuries of the knee can be diagnosed by:  Arthroscopy a surgical technique by which a small video camera is inserted through tiny incisions on the sides of the knee. This procedure is used to examine and repair  internal knee joint problems. Tiny instruments can be used during arthroscopy to repair the torn knee cartilage (meniscus).  Arthrography is a radiology technique. A contrast liquid is directly injected into the knee joint. Internal structures of the knee joint then become visible on X-ray film.  An MRI scan is a non X-ray radiology procedure in which magnetic fields and a computer produce two- or three-dimensional images of the inside of the knee. Cartilage tears are often visible using an MRI scanner. MRI scans have largely replaced arthrography in diagnosing cartilage tears of the knee.  Blood work.  Examination of the fluid that helps to lubricate the knee joint (synovial fluid). This is done by taking a sample out using a needle and a syringe. TREATMENT The treatment of knee problems depends on the cause. Some of these treatments are:  Depending on the injury, proper casting, splinting, surgery, or physical therapy care will be needed.  Give yourself adequate recovery time. Do not overuse your joints. If you begin to get sore during workout routines, back off. Slow down or do fewer repetitions.  For repetitive activities such as cycling or running, maintain your strength and nutrition.  Alternate muscle groups. For example, if you are a weight lifter, work the upper body on one day and the lower body the next.  Either tight or weak muscles do not give the proper support for your knee. Tight or weak muscles do not absorb the stress placed on the knee joint. Keep the muscles surrounding  the knee strong.  Take care of mechanical problems.  If you have flat feet, orthotics or special shoes may help. See your caregiver if you need help.  Arch supports, sometimes with wedges on the inner or outer aspect of the heel, can help. These can shift pressure away from the side of the knee most bothered by osteoarthritis.  A brace called an "unloader" brace also may be used to help ease the  pressure on the most arthritic side of the knee.  If your caregiver has prescribed crutches, braces, wraps or ice, use as directed. The acronym for this is PRICE. This means protection, rest, ice, compression, and elevation.  Nonsteroidal anti-inflammatory drugs (NSAIDs), can help relieve pain. But if taken immediately after an injury, they may actually increase swelling. Take NSAIDs with food in your stomach. Stop them if you develop stomach problems. Do not take these if you have a history of ulcers, stomach pain, or bleeding from the bowel. Do not take without your caregiver's approval if you have problems with fluid retention, heart failure, or kidney problems.  For ongoing knee problems, physical therapy may be helpful.  Glucosamine and chondroitin are over-the-counter dietary supplements. Both may help relieve the pain of osteoarthritis in the knee. These medicines are different from the usual anti-inflammatory drugs. Glucosamine may decrease the rate of cartilage destruction.  Injections of a corticosteroid drug into your knee joint may help reduce the symptoms of an arthritis flare-up. They may provide pain relief that lasts a few months. You may have to wait a few months between injections. The injections do have a small increased risk of infection, water retention, and elevated blood sugar levels.  Hyaluronic acid injected into damaged joints may ease pain and provide lubrication. These injections may work by reducing inflammation. A series of shots may give relief for as long as 6 months.  Topical painkillers. Applying certain ointments to your skin may help relieve the pain and stiffness of osteoarthritis. Ask your pharmacist for suggestions. Many over the-counter products are approved for temporary relief of arthritis pain.  In some countries, doctors often prescribe topical NSAIDs for relief of chronic conditions such as arthritis and tendinitis. A review of treatment with NSAID creams  found that they worked as well as oral medications but without the serious side effects. PREVENTION  Maintain a healthy weight. Extra pounds put more strain on your joints.  Get strong, stay limber. Weak muscles are a common cause of knee injuries. Stretching is important. Include flexibility exercises in your workouts.  Be smart about exercise. If you have osteoarthritis, chronic knee pain or recurring injuries, you may need to change the way you exercise. This does not mean you have to stop being active. If your knees ache after jogging or playing basketball, consider switching to swimming, water aerobics, or other low-impact activities, at least for a few days a week. Sometimes limiting high-impact activities will provide relief.  Make sure your shoes fit well. Choose footwear that is right for your sport.  Protect your knees. Use the proper gear for knee-sensitive activities. Use kneepads when playing volleyball or laying carpet. Buckle your seat belt every time you drive. Most shattered kneecaps occur in car accidents.  Rest when you are tired. SEEK MEDICAL CARE IF:  You have knee pain that is continual and does not seem to be getting better.  SEEK IMMEDIATE MEDICAL CARE IF:  Your knee joint feels hot to the touch and you have a high fever. MAKE SURE  YOU:   Understand these instructions.  Will watch your condition.  Will get help right away if you are not doing well or get worse. Document Released: 02/06/2007 Document Revised: 07/04/2011 Document Reviewed: 02/06/2007 [facility] Patient Information 2015 [place], [place]. This information is not intended to replace advice given to you by your health care provider. Make sure you discuss any questions you have with your health care provider. RICE: Routine Care for Injuries The routine care of many injuries includes Rest, Ice, Compression, and Elevation (RICE). HOME CARE INSTRUCTIONS  Rest is needed to allow your body to heal. Routine  activities can usually be resumed when comfortable. Injured tendons and bones can take up to 6 weeks to heal. Tendons are the cord-like structures that attach muscle to bone.  Ice following an injury helps keep the swelling down and reduces pain.  Put ice in a plastic bag.  Place a towel between your skin and the bag.  Leave the ice on for 15-20 minutes, 3-4 times a day, or as directed by your health care provider. Do this while awake, for the first 24 to 48 hours. After that, continue as directed by your caregiver.  Compression helps keep swelling down. It also gives support and helps with discomfort. If an elastic bandage has been applied, it should be removed and reapplied every 3 to 4 hours. It should not be applied tightly, but firmly enough to keep swelling down. Watch fingers or toes for swelling, bluish discoloration, coldness, numbness, or excessive pain. If any of these problems occur, remove the bandage and reapply loosely. Contact your caregiver if these problems continue.  Elevation helps reduce swelling and decreases pain. With extremities, such as the arms, hands, legs, and feet, the injured area should be placed near or above the level of the heart, if possible. SEEK IMMEDIATE MEDICAL CARE IF:  You have persistent pain and swelling.  You develop redness, numbness, or unexpected weakness.  Your symptoms are getting worse rather than improving after several days. These symptoms may indicate that further evaluation or further X-rays are needed. Sometimes, X-rays may not show a small broken bone (fracture) until 1 week or 10 days later. Make a follow-up appointment with your caregiver. Ask when your X-ray results will be ready. Make sure you get your X-ray results. Document Released: 07/24/2000 Document Revised: 04/16/2013 Document Reviewed: 09/10/2010 [facility] Patient Information 2015 [place], [place]. This information is not intended to replace advice given to you by your health  care provider. Make sure you discuss any questions you have with your health care provider.  Managing Your High Blood Pressure Blood pressure is a measurement of how forceful your blood is pressing against the walls of the arteries. Arteries are muscular tubes within the circulatory system. Blood pressure does not stay the same. Blood pressure rises when you are active, excited, or nervous; and it lowers during sleep and relaxation. If the numbers measuring your blood pressure stay above normal most of the time, you are at risk for health problems. High blood pressure (hypertension) is a long-term (chronic) condition in which blood pressure is elevated. A blood pressure reading is recorded as two numbers, such as 120 over 80 (or 120/80). The first, higher number is called the systolic pressure. It is a measure of the pressure in your arteries as the heart beats. The second, lower number is called the diastolic pressure. It is a measure of the pressure in your arteries as the heart relaxes between beats.  Keeping your blood  pressure in a normal range is important to your overall health and prevention of health problems, such as heart disease and stroke. When your blood pressure is uncontrolled, your heart has to work harder than normal. High blood pressure is a very common condition in adults because blood pressure tends to rise with age. Men and women are equally likely to have hypertension but at different times in life. Before age 36, men are more likely to have hypertension. After 55 years of age, women are more likely to have it. Hypertension is especially common in African Americans. This condition often has no signs or symptoms. The cause of the condition is usually not known. Your caregiver can help you come up with a plan to keep your blood pressure in a normal, healthy range. BLOOD PRESSURE STAGES Blood pressure is classified into four stages: normal, prehypertension, stage 1, and stage 2. Your blood  pressure reading will be used to determine what type of treatment, if any, is necessary. Appropriate treatment options are tied to these four stages:  Normal  Systolic pressure (mm Hg): below 120.  Diastolic pressure (mm Hg): below 80. Prehypertension  Systolic pressure (mm Hg): 120 to 139.  Diastolic pressure (mm Hg): 80 to 89. Stage1  Systolic pressure (mm Hg): 140 to 159.  Diastolic pressure (mm Hg): 90 to 99. Stage2  Systolic pressure (mm Hg): 160 or above.  Diastolic pressure (mm Hg): 100 or above. RISKS RELATED TO HIGH BLOOD PRESSURE Managing your blood pressure is an important responsibility. Uncontrolled high blood pressure can lead to:  A heart attack.  A stroke.  A weakened blood vessel (aneurysm).  Heart failure.  Kidney damage.  Eye damage.  Metabolic syndrome.  Memory and concentration problems. HOW TO MANAGE YOUR BLOOD PRESSURE Blood pressure can be managed effectively with lifestyle changes and medicines (if needed). Your caregiver will help you come up with a plan to bring your blood pressure within a normal range. Your plan should include the following: Education  Read all information provided by your caregivers about how to control blood pressure.  Educate yourself on the latest guidelines and treatment recommendations. New research is always being done to further define the risks and treatments for high blood pressure. Lifestylechanges  Control your weight.  Avoid smoking.  Stay physically active.  Reduce the amount of salt in your diet.  Reduce stress.  Control any chronic conditions, such as high cholesterol or diabetes.  Reduce your alcohol intake. Medicines  Several medicines (antihypertensive medicines) are available, if needed, to bring blood pressure within a normal range. Communication  Review all the medicines you take with your caregiver because there may be side effects or interactions.  Talk with your caregiver  about your diet, exercise habits, and other lifestyle factors that may be contributing to high blood pressure.  See your caregiver regularly. Your caregiver can help you create and adjust your plan for managing high blood pressure. RECOMMENDATIONS FOR TREATMENT AND FOLLOW-UP  The following recommendations are based on current guidelines for managing high blood pressure in nonpregnant adults. Use these recommendations to identify the proper follow-up period or treatment option based on your blood pressure reading. You can discuss these options with your caregiver.  Systolic pressure of 585 to 277 or diastolic pressure of 80 to 89: Follow up with your caregiver as directed.  Systolic pressure of 824 to 235 or diastolic pressure of 90 to 100: Follow up with your caregiver within 2 months.  Systolic pressure above 361 or  diastolic pressure above 350: Follow up with your caregiver within 1 month.  Systolic pressure above 757 or diastolic pressure above 322: Consider antihypertensive therapy; follow up with your caregiver within 1 week.  Systolic pressure above 567 or diastolic pressure above 209: Begin antihypertensive therapy; follow up with your caregiver within 1 week. Document Released: 01/04/2012 Document Reviewed: 01/04/2012 [facility] Patient Information 2015 [place]. This information is not intended to replace advice given to you by your health care provider. Make sure you discuss any questions you have with your health care provider.

## 2014-03-13 NOTE — ED Notes (Signed)
Patient transported to X-ray 
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
## 2014-04-21 ENCOUNTER — Telehealth: Payer: Self-pay | Admitting: *Deleted

## 2014-04-21 MED ORDER — OXYCODONE-ACETAMINOPHEN 5-325 MG PO TABS: 1.0000 | ORAL_TABLET | Freq: Three times a day (TID) | ORAL | Status: DC | PRN

## 2014-04-21 NOTE — Telephone Encounter (Signed)
Pt request rx for pain today. 04/21/2014

## 2014-06-04 ENCOUNTER — Telehealth: Payer: Self-pay | Admitting: *Deleted

## 2014-06-04 NOTE — Telephone Encounter (Signed)
FYI 06/04/2014 Patient called this afternoon and ask could I do him a small favor and ask you for a Rx for foot pain, his insurance kicks in next month.  I told him You were no longer giving out prescriptions, he could be seen but that doesn't mean he would get a rx. He also said his foot was swollen. He states he doesn't have $$ right now.

## 2014-06-19 ENCOUNTER — Encounter (HOSPITAL_BASED_OUTPATIENT_CLINIC_OR_DEPARTMENT_OTHER): Payer: Self-pay | Admitting: *Deleted

## 2014-06-19 ENCOUNTER — Emergency Department (HOSPITAL_BASED_OUTPATIENT_CLINIC_OR_DEPARTMENT_OTHER)
Admission: EM | Admit: 2014-06-19 | Discharge: 2014-06-19 | Disposition: A | Discharge status: Home / Self Care | Payer: Self-pay | Attending: Emergency Medicine | Admitting: Emergency Medicine

## 2014-06-19 DIAGNOSIS — M545 Low back pain, unspecified: Secondary | ICD-10-CM

## 2014-06-19 DIAGNOSIS — M549 Dorsalgia, unspecified: Secondary | ICD-10-CM

## 2014-06-19 DIAGNOSIS — Z87828 Personal history of other (healed) physical injury and trauma: Secondary | ICD-10-CM | POA: Insufficient documentation

## 2014-06-19 DIAGNOSIS — Z86718 Personal history of other venous thrombosis and embolism: Secondary | ICD-10-CM | POA: Insufficient documentation

## 2014-06-19 DIAGNOSIS — Z791 Long term (current) use of non-steroidal anti-inflammatories (NSAID): Secondary | ICD-10-CM | POA: Insufficient documentation

## 2014-06-19 DIAGNOSIS — M109 Gout, unspecified: Secondary | ICD-10-CM | POA: Insufficient documentation

## 2014-06-19 DIAGNOSIS — G8929 Other chronic pain: Secondary | ICD-10-CM | POA: Insufficient documentation

## 2014-06-19 DIAGNOSIS — G43909 Migraine, unspecified, not intractable, without status migrainosus: Secondary | ICD-10-CM | POA: Insufficient documentation

## 2014-06-19 DIAGNOSIS — F329 Major depressive disorder, single episode, unspecified: Secondary | ICD-10-CM | POA: Insufficient documentation

## 2014-06-19 DIAGNOSIS — Z792 Long term (current) use of antibiotics: Secondary | ICD-10-CM | POA: Insufficient documentation

## 2014-06-19 DIAGNOSIS — Z8719 Personal history of other diseases of the digestive system: Secondary | ICD-10-CM | POA: Insufficient documentation

## 2014-06-19 DIAGNOSIS — Z79899 Other long term (current) drug therapy: Secondary | ICD-10-CM | POA: Insufficient documentation

## 2014-06-19 DIAGNOSIS — Z86018 Personal history of other benign neoplasm: Secondary | ICD-10-CM | POA: Insufficient documentation

## 2014-06-19 DIAGNOSIS — Z7952 Long term (current) use of systemic steroids: Secondary | ICD-10-CM | POA: Insufficient documentation

## 2014-06-19 DIAGNOSIS — Z87442 Personal history of urinary calculi: Secondary | ICD-10-CM | POA: Insufficient documentation

## 2014-06-19 MED ORDER — KETOROLAC TROMETHAMINE 60 MG/2ML IM SOLN
60.0000 mg | Freq: Once | INTRAMUSCULAR | Status: AC
  Administered 2014-06-19: 60 mg via INTRAMUSCULAR
  Filled 2014-06-19: qty 2

## 2014-06-19 MED ORDER — TRAMADOL HCL 50 MG PO TABS: 50.0000 mg | ORAL_TABLET | Freq: Four times a day (QID) | ORAL | Status: DC | PRN

## 2014-06-19 MED ORDER — CYCLOBENZAPRINE HCL 10 MG PO TABS: 10.0000 mg | ORAL_TABLET | Freq: Three times a day (TID) | ORAL | Status: DC | PRN
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
## 2014-06-19 NOTE — Discharge Instructions (Signed)
Take tramadol as prescribed. No driving or operating heavy machinery while taking flexeril. This medication may make you drowsy. Rest, apply ice intermittently followed by heat. Avoid heavy lifting or hard physical activity. Follow up with the back specialist as scheduled.  Back Pain, Adult Low back pain is very common. About 1 in 5 people have back pain.The cause of low back pain is rarely dangerous. The pain often gets better over time.About half of people with a sudden onset of back pain feel better in just 2 weeks. About 8 in 10 people feel better by 6 weeks.  CAUSES Some common causes of back pain include:  Strain of the muscles or ligaments supporting the spine.  Wear and tear (degeneration) of the spinal discs.  Arthritis.  Direct injury to the back. DIAGNOSIS Most of the time, the direct cause of low back pain is not known.However, back pain can be treated effectively even when the exact cause of the pain is unknown.Answering your caregiver's questions about your overall health and symptoms is one of the most accurate ways to make sure the cause of your pain is not dangerous. If your caregiver needs more information, he or she may order lab work or imaging tests (X-rays or MRIs).However, even if imaging tests show changes in your back, this usually does not require surgery. HOME CARE INSTRUCTIONS For many people, back pain returns.Since low back pain is rarely dangerous, it is often a condition that people can learn to [facility] their own.   Remain active. It is stressful on the back to sit or stand in one place. Do not sit, drive, or stand in one place for more than 30 minutes at a time. Take short walks on level surfaces as soon as pain allows.Try to increase the length of time you walk each day.  Do not stay in bed.Resting more than 1 or 2 days can delay your recovery.  Do not avoid exercise or work.Your body is made to move.It is not dangerous to be active, even though  your back may hurt.Your back will likely heal faster if you return to being active before your pain is gone.  Pay attention to your body when you bend and lift. Many people have less discomfortwhen lifting if they bend their knees, keep the load close to their bodies,and avoid twisting. Often, the most comfortable positions are those that put less stress on your recovering back.  Find a comfortable position to sleep. Use a firm mattress and lie on your side with your knees slightly bent. If you lie on your back, put a pillow under your knees.  Only take over-the-counter or prescription medicines as directed by your caregiver. Over-the-counter medicines to reduce pain and inflammation are often the most helpful.Your caregiver may prescribe muscle relaxant drugs.These medicines help dull your pain so you can more quickly return to your normal activities and healthy exercise.  Put ice on the injured area.  Put ice in a plastic bag.  Place a towel between your skin and the bag.  Leave the ice on for 15-20 minutes, 03-04 times a day for the first 2 to 3 days. After that, ice and heat may be alternated to reduce pain and spasms.  Ask your caregiver about trying back exercises and gentle massage. This may be of some benefit.  Avoid feeling anxious or stressed.Stress increases muscle tension and can worsen back pain.It is important to recognize when you are anxious or stressed and learn ways to manage it.Exercise is a  great option. SEEK MEDICAL CARE IF:  You have pain that is not relieved with rest or medicine.  You have pain that does not improve in 1 week.  You have new symptoms.  You are generally not feeling well. SEEK IMMEDIATE MEDICAL CARE IF:   You have pain that radiates from your back into your legs.  You develop new bowel or bladder control problems.  You have unusual weakness or numbness in your arms or legs.  You develop nausea or vomiting.  You develop abdominal  pain.  You feel faint. Document Released: 04/11/2005 Document Revised: 10/11/2011 Document Reviewed: 08/13/2013 [facility] Patient Information 2015 [place], [place]. This information is not intended to replace advice given to you by your health care provider. Make sure you discuss any questions you have with your health care provider.  Chronic Back Pain  When back pain lasts longer than 3 months, it is called chronic back pain.People with chronic back pain often go through certain periods that are more intense (flare-ups).  CAUSES Chronic back pain can be caused by wear and tear (degeneration) on different structures in your back. These structures include:  The bones of your spine (vertebrae) and the joints surrounding your spinal cord and nerve roots (facets).  The strong, fibrous tissues that connect your vertebrae (ligaments). Degeneration of these structures may result in pressure on your nerves. This can lead to constant pain. HOME CARE INSTRUCTIONS  Avoid bending, heavy lifting, prolonged sitting, and activities which make the problem worse.  Take brief periods of rest throughout the day to reduce your pain. Lying down or standing usually is better than sitting while you are resting.  Take over-the-counter or prescription medicines only as directed by your caregiver. SEEK IMMEDIATE MEDICAL CARE IF:   You have weakness or numbness in one of your legs or feet.  You have trouble controlling your bladder or bowels.  You have nausea, vomiting, abdominal pain, shortness of breath, or fainting. Document Released: 05/19/2004 Document Revised: 07/04/2011 Document Reviewed: 03/26/2011 [facility] Patient Information 2015 [place], [place]. This information is not intended to replace advice given to you by your health care provider. Make sure you discuss any questions you have with your health care provider.

## 2014-06-19 NOTE — ED Notes (Signed)
Back pain for a week. Started after lifting a bundle of blankets. He was seen at [facility] last week for same.

## 2014-06-20 ENCOUNTER — Ambulatory Visit (INDEPENDENT_AMBULATORY_CARE_PROVIDER_SITE_OTHER): Payer: Self-pay | Admitting: Podiatry

## 2014-06-20 ENCOUNTER — Encounter: Payer: Self-pay | Admitting: Podiatry

## 2014-06-20 VITALS — BP 200/110 | HR 60

## 2014-06-20 DIAGNOSIS — M21962 Unspecified acquired deformity of left lower leg: Secondary | ICD-10-CM

## 2014-06-20 DIAGNOSIS — M7742 Metatarsalgia, left foot: Secondary | ICD-10-CM

## 2014-06-20 MED ORDER — OXYCODONE-ACETAMINOPHEN 5-325 MG PO TABS: 1.0000 | ORAL_TABLET | Freq: Three times a day (TID) | ORAL | Status: DC | PRN

## 2014-06-20 NOTE — Progress Notes (Signed)
Subjective:  54 year old male presents complaining of pain and swelling on both L>R. Having difficulty working. Pain is on top lateral column. Been off from work for 3 days because of foot pain. Has had right foot surgery and helping, but still having some pain. Patient request pain medication that is needed for him continue his work. He takes one pill a day.   Objective: Positive of hallux valgus with prominent bunion on right. Neurovascular status are within normal. All pedal pulses are palpable bilateral.  Assessment: Lesser metatarsal and mid foot pain bilateral L>R.  Plan: Reviewed findings and available options. Pain medication prescribed as per request.

## 2014-06-20 NOTE — Patient Instructions (Signed)
Seen for painful feet. May take pain medication as needed.

## 2014-08-15 ENCOUNTER — Ambulatory Visit (INDEPENDENT_AMBULATORY_CARE_PROVIDER_SITE_OTHER): Payer: Self-pay | Admitting: Podiatry

## 2014-08-15 ENCOUNTER — Encounter: Payer: Self-pay | Admitting: Podiatry

## 2014-08-15 DIAGNOSIS — M21961 Unspecified acquired deformity of right lower leg: Secondary | ICD-10-CM

## 2014-08-15 DIAGNOSIS — M25579 Pain in unspecified ankle and joints of unspecified foot: Secondary | ICD-10-CM | POA: Insufficient documentation

## 2014-08-15 DIAGNOSIS — M25571 Pain in right ankle and joints of right foot: Secondary | ICD-10-CM

## 2014-08-15 MED ORDER — OXYCODONE-ACETAMINOPHEN 5-325 MG PO TABS: 1.0000 | ORAL_TABLET | Freq: Three times a day (TID) | ORAL | Status: DC | PRN

## 2014-08-15 NOTE — Progress Notes (Signed)
Subjective: 54 year old male presents complaining of pain in right foot in big joint.  Still on feet for long hours at work. He has started a new job.   Objective: No visible edema noted.  Pain at IPJ of right hallux and the MPJ. Hallux valgus with bunion right foot.  No edema or erythema noted. Neurovascular status are within normal.   Assessment: Arthralgia IPJ right hallux.   Plan: Reviewed findings. Patient refuse to receive Cortisone injection.  Medication prescribed as per request.

## 2014-08-15 NOTE — Patient Instructions (Signed)
Seen for pain in right foot. Return as needed. Rx for Oxidone 5/325 given.

## 2014-08-18 ENCOUNTER — Telehealth: Payer: Self-pay | Admitting: *Deleted

## 2014-08-18 NOTE — Telephone Encounter (Signed)
08/08/14  Dr. Caffie Pinto, [facility] called this am and said the Percocet he received on Friday has made him very sick, throwing up this weekend, he has stopped taking this. He ask can you give him something else, he said he had took hydrocodone before if you could give him that.

## 2014-08-26 ENCOUNTER — Emergency Department (HOSPITAL_BASED_OUTPATIENT_CLINIC_OR_DEPARTMENT_OTHER)
Admission: EM | Admit: 2014-08-26 | Discharge: 2014-08-26 | Disposition: A | Discharge status: Home / Self Care | Payer: Self-pay | Attending: Emergency Medicine | Admitting: Emergency Medicine

## 2014-08-26 ENCOUNTER — Encounter (HOSPITAL_BASED_OUTPATIENT_CLINIC_OR_DEPARTMENT_OTHER): Payer: Self-pay | Admitting: *Deleted

## 2014-08-26 DIAGNOSIS — F329 Major depressive disorder, single episode, unspecified: Secondary | ICD-10-CM | POA: Insufficient documentation

## 2014-08-26 DIAGNOSIS — Z86018 Personal history of other benign neoplasm: Secondary | ICD-10-CM | POA: Insufficient documentation

## 2014-08-26 DIAGNOSIS — M10042 Idiopathic gout, left hand: Secondary | ICD-10-CM | POA: Insufficient documentation

## 2014-08-26 DIAGNOSIS — M109 Gout, unspecified: Secondary | ICD-10-CM

## 2014-08-26 DIAGNOSIS — Z87828 Personal history of other (healed) physical injury and trauma: Secondary | ICD-10-CM | POA: Insufficient documentation

## 2014-08-26 DIAGNOSIS — Z87442 Personal history of urinary calculi: Secondary | ICD-10-CM | POA: Insufficient documentation

## 2014-08-26 DIAGNOSIS — Z86718 Personal history of other venous thrombosis and embolism: Secondary | ICD-10-CM | POA: Insufficient documentation

## 2014-08-26 DIAGNOSIS — Z79899 Other long term (current) drug therapy: Secondary | ICD-10-CM | POA: Insufficient documentation

## 2014-08-26 DIAGNOSIS — Z8679 Personal history of other diseases of the circulatory system: Secondary | ICD-10-CM | POA: Insufficient documentation

## 2014-08-26 DIAGNOSIS — Z872 Personal history of diseases of the skin and subcutaneous tissue: Secondary | ICD-10-CM | POA: Insufficient documentation

## 2014-08-26 MED ORDER — PREDNISONE 20 MG PO TABS: ORAL_TABLET | ORAL | Status: DC

## 2014-08-26 MED ORDER — OXYCODONE-ACETAMINOPHEN 5-325 MG PO TABS
2.0000 | ORAL_TABLET | Freq: Once | ORAL | Status: AC
  Administered 2014-08-26: 2 via ORAL
  Filled 2014-08-26: qty 2

## 2014-08-26 MED ORDER — OXYCODONE-ACETAMINOPHEN 5-325 MG PO TABS
1.0000 | ORAL_TABLET | Freq: Four times a day (QID) | ORAL | Status: DC | PRN
Start: 2014-08-26 — End: 2014-10-03

## 2014-08-26 MED ORDER — PREDNISONE 50 MG PO TABS
60.0000 mg | ORAL_TABLET | Freq: Once | ORAL | Status: AC
  Administered 2014-08-26: 60 mg via ORAL
  Filled 2014-08-26 (×2): qty 1

## 2014-08-26 NOTE — ED Notes (Signed)
C/o right hand with redness and swelling noted. States hx of gout and feels like the same. No other sx and no injury.
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
## 2014-08-26 NOTE — Discharge Instructions (Signed)
Take percocet for severe pain only. No driving or operating heavy machinery while taking percocet. This medication may cause drowsiness. Ice and elevate your hand. Take prednisone beginning tomorrow for the next 4 days as you were given the first dose in the emergency department today. Follow-up with your primary care physician within the next 2-3 days.  Gout Gout is an inflammatory arthritis caused by a buildup of uric acid crystals in the joints. Uric acid is a chemical that is normally present in the blood. When the level of uric acid in the blood is too high it can form crystals that deposit in your joints and tissues. This causes joint redness, soreness, and swelling (inflammation). Repeat attacks are common. Over time, uric acid crystals can form into masses (tophi) near a joint, destroying bone and causing disfigurement. Gout is treatable and often preventable. CAUSES  The disease begins with elevated levels of uric acid in the blood. Uric acid is produced by your body when it breaks down a naturally found substance called purines. Certain foods you eat, such as meats and fish, contain high amounts of purines. Causes of an elevated uric acid level include:  Being passed down from parent to child (heredity).  Diseases that cause increased uric acid production (such as obesity, psoriasis, and certain cancers).  Excessive alcohol use.  Diet, especially diets rich in meat and seafood.  Medicines, including certain cancer-fighting medicines (chemotherapy), water pills (diuretics), and aspirin.  Chronic kidney disease. The kidneys are no longer able to remove uric acid well.  Problems with metabolism. Conditions strongly associated with gout include:  Obesity.  High blood pressure.  High cholesterol.  Diabetes. Not everyone with elevated uric acid levels gets gout. It is not understood why some people get gout and others do not. Surgery, joint injury, and eating too much of certain  foods are some of the factors that can lead to gout attacks. SYMPTOMS   An attack of gout comes on quickly. It causes intense pain with redness, swelling, and warmth in a joint.  Fever can occur.  Often, only one joint is involved. Certain joints are more commonly involved:  Base of the big toe.  Knee.  Ankle.  Wrist.  Finger. Without treatment, an attack usually goes away in a few days to weeks. Between attacks, you usually will not have symptoms, which is different from many other forms of arthritis. DIAGNOSIS  Your caregiver will suspect gout based on your symptoms and exam. In some cases, tests may be recommended. The tests may include:  Blood tests.  Urine tests.  X-rays.  Joint fluid exam. This exam requires a needle to remove fluid from the joint (arthrocentesis). Using a microscope, gout is confirmed when uric acid crystals are seen in the joint fluid. TREATMENT  There are two phases to gout treatment: treating the sudden onset (acute) attack and preventing attacks (prophylaxis).  Treatment of an Acute Attack.  Medicines are used. These include anti-inflammatory medicines or steroid medicines.  An injection of steroid medicine into the affected joint is sometimes necessary.  The painful joint is rested. Movement can worsen the arthritis.  You may use warm or cold treatments on painful joints, depending which works best for you.  Treatment to Prevent Attacks.  If you suffer from frequent gout attacks, your caregiver may advise preventive medicine. These medicines are started after the acute attack subsides. These medicines either help your kidneys eliminate uric acid from your body or decrease your uric acid production. You may  need to stay on these medicines for a very long time.  The early phase of treatment with preventive medicine can be associated with an increase in acute gout attacks. For this reason, during the first few months of treatment, your caregiver  may also advise you to take medicines usually used for acute gout treatment. Be sure you understand your caregiver's directions. Your caregiver may make several adjustments to your medicine dose before these medicines are effective.  Discuss dietary treatment with your caregiver or dietitian. Alcohol and drinks high in sugar and fructose and foods such as meat, poultry, and seafood can increase uric acid levels. Your caregiver or dietitian can advise you on drinks and foods that should be limited. HOME CARE INSTRUCTIONS   Do not take aspirin to relieve pain. This raises uric acid levels.  Only take over-the-counter or prescription medicines for pain, discomfort, or fever as directed by your caregiver.  Rest the joint as much as possible. When in bed, keep sheets and blankets off painful areas.  Keep the affected joint raised (elevated).  Apply warm or cold treatments to painful joints. Use of warm or cold treatments depends on which works best for you.  Use crutches if the painful joint is in your leg.  Drink enough fluids to keep your urine clear or pale yellow. This helps your body get rid of uric acid. Limit alcohol, sugary drinks, and fructose drinks.  Follow your dietary instructions. Pay careful attention to the amount of protein you eat. Your daily diet should emphasize fruits, vegetables, whole grains, and fat-free or low-fat milk products. Discuss the use of coffee, vitamin C, and cherries with your caregiver or dietitian. These may be helpful in lowering uric acid levels.  Maintain a healthy body weight. SEEK MEDICAL CARE IF:   You develop diarrhea, vomiting, or any side effects from medicines.  You do not feel better in 24 hours, or you are getting worse. SEEK IMMEDIATE MEDICAL CARE IF:   Your joint becomes suddenly more tender, and you have chills or a fever. MAKE SURE YOU:   Understand these instructions.  Will watch your condition.  Will get help right away if you  are not doing well or get worse. Document Released: 04/08/2000 Document Revised: 08/26/2013 Document Reviewed: 11/23/2011 [facility] Patient Information 2015 [place], [place]. This information is not intended to replace advice given to you by your health care provider. Make sure you discuss any questions you have with your health care provider.

## 2014-09-12 ENCOUNTER — Ambulatory Visit: Payer: Self-pay | Admitting: Podiatry

## 2014-10-03 ENCOUNTER — Encounter: Payer: Self-pay | Admitting: Podiatry

## 2014-10-03 ENCOUNTER — Ambulatory Visit (INDEPENDENT_AMBULATORY_CARE_PROVIDER_SITE_OTHER): Payer: Self-pay | Admitting: Podiatry

## 2014-10-03 DIAGNOSIS — L851 Acquired keratosis [keratoderma] palmaris et plantaris: Secondary | ICD-10-CM

## 2014-10-03 DIAGNOSIS — M2042 Other hammer toe(s) (acquired), left foot: Secondary | ICD-10-CM | POA: Insufficient documentation

## 2014-10-03 MED ORDER — OXYCODONE-ACETAMINOPHEN 5-325 MG PO TABS: 1.0000 | ORAL_TABLET | Freq: Four times a day (QID) | ORAL | Status: DC | PRN

## 2014-10-03 NOTE — Patient Instructions (Signed)
Seen for hypertrophic corn. Debrided and padded.  Return as needed.

## 2014-10-03 NOTE — Progress Notes (Signed)
54 year old male presents complaining of pain in 2nd toe left. Noted of inflamed digital corn at lateral aspect of DIPJ 2nd digit left.   Assessment: Inflamed digital corn 2nd left painful.  Plan:  Lesion debrided and padded. Return as needed.

## 2014-10-20 ENCOUNTER — Emergency Department (HOSPITAL_BASED_OUTPATIENT_CLINIC_OR_DEPARTMENT_OTHER)
Admission: EM | Admit: 2014-10-20 | Discharge: 2014-10-20 | Disposition: A | Discharge status: Home / Self Care | Payer: Self-pay | Attending: Emergency Medicine | Admitting: Emergency Medicine

## 2014-10-20 ENCOUNTER — Encounter (HOSPITAL_BASED_OUTPATIENT_CLINIC_OR_DEPARTMENT_OTHER): Payer: Self-pay | Admitting: *Deleted

## 2014-10-20 ENCOUNTER — Emergency Department (HOSPITAL_BASED_OUTPATIENT_CLINIC_OR_DEPARTMENT_OTHER): Payer: Self-pay

## 2014-10-20 DIAGNOSIS — Z87442 Personal history of urinary calculi: Secondary | ICD-10-CM | POA: Insufficient documentation

## 2014-10-20 DIAGNOSIS — I10 Essential (primary) hypertension: Secondary | ICD-10-CM | POA: Insufficient documentation

## 2014-10-20 DIAGNOSIS — Z86018 Personal history of other benign neoplasm: Secondary | ICD-10-CM | POA: Insufficient documentation

## 2014-10-20 DIAGNOSIS — Z86718 Personal history of other venous thrombosis and embolism: Secondary | ICD-10-CM | POA: Insufficient documentation

## 2014-10-20 DIAGNOSIS — Z79899 Other long term (current) drug therapy: Secondary | ICD-10-CM | POA: Insufficient documentation

## 2014-10-20 DIAGNOSIS — G43909 Migraine, unspecified, not intractable, without status migrainosus: Secondary | ICD-10-CM | POA: Insufficient documentation

## 2014-10-20 DIAGNOSIS — F329 Major depressive disorder, single episode, unspecified: Secondary | ICD-10-CM | POA: Insufficient documentation

## 2014-10-20 DIAGNOSIS — M79674 Pain in right toe(s): Secondary | ICD-10-CM | POA: Insufficient documentation

## 2014-10-20 MED ORDER — OXYCODONE-ACETAMINOPHEN 5-325 MG PO TABS: 1.0000 | ORAL_TABLET | Freq: Three times a day (TID) | ORAL | Status: DC | PRN

## 2014-10-20 MED ORDER — OXYCODONE-ACETAMINOPHEN 5-325 MG PO TABS
2.0000 | ORAL_TABLET | Freq: Once | ORAL | Status: AC
  Administered 2014-10-20: 2 via ORAL
  Filled 2014-10-20: qty 2

## 2014-10-20 MED ORDER — CLINDAMYCIN HCL 300 MG PO CAPS: 300.0000 mg | ORAL_CAPSULE | Freq: Four times a day (QID) | ORAL | Status: DC
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
## 2014-10-20 NOTE — ED Notes (Signed)
Pt c/o right big toe pain with redness x 1 day

## 2014-10-20 NOTE — Discharge Instructions (Signed)
Please see your podiatrist in 2 days Return earlier for increased swelling/redness/pain in right toe over next 24 hours

## 2014-10-21 ENCOUNTER — Ambulatory Visit (INDEPENDENT_AMBULATORY_CARE_PROVIDER_SITE_OTHER): Payer: Self-pay | Admitting: Podiatry

## 2014-10-21 ENCOUNTER — Encounter: Payer: Self-pay | Admitting: Podiatry

## 2014-10-21 VITALS — BP 167/102 | HR 87

## 2014-10-21 DIAGNOSIS — L03039 Cellulitis of unspecified toe: Secondary | ICD-10-CM

## 2014-10-21 DIAGNOSIS — L03031 Cellulitis of right toe: Secondary | ICD-10-CM

## 2014-10-21 DIAGNOSIS — M10071 Idiopathic gout, right ankle and foot: Secondary | ICD-10-CM

## 2014-10-21 DIAGNOSIS — L02619 Cutaneous abscess of unspecified foot: Secondary | ICD-10-CM | POA: Insufficient documentation

## 2014-10-21 DIAGNOSIS — L02611 Cutaneous abscess of right foot: Secondary | ICD-10-CM

## 2014-10-21 MED ORDER — OXYCODONE-ACETAMINOPHEN 5-325 MG PO TABS: 1.0000 | ORAL_TABLET | Freq: Three times a day (TID) | ORAL | Status: DC | PRN

## 2014-10-21 NOTE — Progress Notes (Signed)
Subjective: 54 year old male presents with swollen painful right great toe. He went to ER for this condition and got antibiotics and a few pain pills.  Stated that he could not continue his shift at work last night due to extreme pain.  Objective: Positive of red swollen joint expending only through dorsum of the great toe.  No other areas been affected.  Assessment: Rule out abscess in joint.  Treatment: The right great toe was anesthetized with 26ml of 50/50 mixture 0.5% Marcaine plain and 1% Xylocaine plain. After area was prepped with Iodine, swollen toe was lanced with #15 blade deep down to joint level. Noted of some serous fluid and white Tophus like substance from the joint space.  The area was squeezed and drained out.  2mg  of Dexamethasone and 2mg  of Triamcinolone injected proximal to the joint. Compression dressing applied.  Advised to take Indocin 50 mg three times a day till the pain stop.

## 2014-10-21 NOTE — Patient Instructions (Addendum)
Seen for right great toe pain. Lanced and drained  possible Gouty Tophus. Take Indocin 50mg  three times a day. Take pain medication if needed. Return in one week.

## 2014-10-31 ENCOUNTER — Encounter: Payer: Self-pay | Admitting: Podiatry

## 2014-10-31 ENCOUNTER — Ambulatory Visit (INDEPENDENT_AMBULATORY_CARE_PROVIDER_SITE_OTHER): Payer: Self-pay | Admitting: Podiatry

## 2014-10-31 VITALS — BP 171/102 | HR 66

## 2014-10-31 DIAGNOSIS — M10071 Idiopathic gout, right ankle and foot: Secondary | ICD-10-CM

## 2014-10-31 DIAGNOSIS — M25571 Pain in right ankle and joints of right foot: Secondary | ICD-10-CM

## 2014-10-31 DIAGNOSIS — M109 Gout, unspecified: Secondary | ICD-10-CM

## 2014-10-31 MED ORDER — HYDROCODONE-IBUPROFEN 7.5-200 MG PO TABS: 1.0000 | ORAL_TABLET | Freq: Four times a day (QID) | ORAL | Status: DC | PRN

## 2014-10-31 NOTE — Patient Instructions (Signed)
Seen for pain in right great toe. Gouty arthritis right great toe, IPJ. May require surgical fusion of the joint. Return as needed.

## 2014-10-31 NOTE — Progress Notes (Signed)
Follow up on right great toe pain.  After last visit, the toe pain subsided a little but still hurts to walk. Right great toe IPJ is still mildly swollen. No redness or sign of fluid retention. May require fusion of the IPJ right hallux. Rx. Pain medication given as per request.

## 2014-11-12 ENCOUNTER — Encounter: Payer: Self-pay | Admitting: Podiatry

## 2014-11-12 ENCOUNTER — Encounter: Payer: Self-pay | Admitting: *Deleted

## 2014-11-25 ENCOUNTER — Ambulatory Visit: Payer: Self-pay | Admitting: Podiatry

## 2014-11-26 ENCOUNTER — Encounter: Payer: Self-pay | Admitting: Podiatry

## 2014-11-26 ENCOUNTER — Ambulatory Visit (INDEPENDENT_AMBULATORY_CARE_PROVIDER_SITE_OTHER): Payer: Self-pay | Admitting: Podiatry

## 2014-11-26 DIAGNOSIS — M2042 Other hammer toe(s) (acquired), left foot: Secondary | ICD-10-CM

## 2014-11-26 DIAGNOSIS — L851 Acquired keratosis [keratoderma] palmaris et plantaris: Secondary | ICD-10-CM

## 2014-11-26 DIAGNOSIS — L02611 Cutaneous abscess of right foot: Secondary | ICD-10-CM

## 2014-11-26 DIAGNOSIS — L03031 Cellulitis of right toe: Secondary | ICD-10-CM

## 2014-11-26 MED ORDER — OXYCODONE-ACETAMINOPHEN 5-325 MG PO TABS: 1.0000 | ORAL_TABLET | Freq: Three times a day (TID) | ORAL | Status: DC | PRN

## 2014-11-26 NOTE — Patient Instructions (Signed)
Seen for pain in 2nd toe left, and right great toe. Corn debrided. Prescription renewed. Return as needed.

## 2014-11-26 NOTE — Progress Notes (Signed)
Patient presents complaining of pain at IPJ right hallux and corn on 2nd toe left.   Objective: Thick build up corn in medial aspect of PIPJ 2nd digit left. Swollen IPJ right hallux.  Assessment: Arthralgia right hallux. Painful digital corn 2nd left.   Plan: Lesion debrided. Rx. Pain pills refilled.

## 2014-12-02 ENCOUNTER — Ambulatory Visit: Payer: Self-pay | Admitting: Podiatry

## 2014-12-26 ENCOUNTER — Encounter: Payer: Self-pay | Admitting: Podiatry

## 2014-12-26 ENCOUNTER — Ambulatory Visit (INDEPENDENT_AMBULATORY_CARE_PROVIDER_SITE_OTHER): Payer: Self-pay | Admitting: Podiatry

## 2014-12-26 DIAGNOSIS — M10071 Idiopathic gout, right ankle and foot: Secondary | ICD-10-CM

## 2014-12-26 MED ORDER — OXYCODONE-ACETAMINOPHEN 5-325 MG PO TABS: 1.0000 | ORAL_TABLET | Freq: Three times a day (TID) | ORAL | Status: DC | PRN

## 2014-12-26 NOTE — Patient Instructions (Signed)
Seen for pain in right great toe. Rx refilled as per request. Return as needed.

## 2014-12-26 NOTE — Progress Notes (Signed)
Subjective Patient presents complaining of pain at IPJ right hallux and corn on 2nd toe left. Injection helped but wants to wait till the next visit. Patient wants medication refilled today.  Objective: Thick build up corn in medial aspect of PIPJ 2nd digit left. Swollen IPJ right hallux.  Assessment: Arthralgia right hallux. Painful digital corn 2nd left.   Plan: Rx. Pain pills refilled.

## 2015-01-16 ENCOUNTER — Ambulatory Visit (INDEPENDENT_AMBULATORY_CARE_PROVIDER_SITE_OTHER): Payer: BLUE CROSS/BLUE SHIELD | Admitting: Podiatry

## 2015-01-16 ENCOUNTER — Encounter: Payer: Self-pay | Admitting: Podiatry

## 2015-01-16 DIAGNOSIS — M25572 Pain in left ankle and joints of left foot: Secondary | ICD-10-CM

## 2015-01-16 DIAGNOSIS — M2042 Other hammer toe(s) (acquired), left foot: Secondary | ICD-10-CM | POA: Diagnosis not present

## 2015-01-16 DIAGNOSIS — L97529 Non-pressure chronic ulcer of other part of left foot with unspecified severity: Secondary | ICD-10-CM | POA: Insufficient documentation

## 2015-01-16 DIAGNOSIS — L97521 Non-pressure chronic ulcer of other part of left foot limited to breakdown of skin: Secondary | ICD-10-CM

## 2015-01-16 MED ORDER — OXYCODONE-ACETAMINOPHEN 5-325 MG PO TABS: 1.0000 | ORAL_TABLET | Freq: Three times a day (TID) | ORAL | Status: DC | PRN

## 2015-01-16 NOTE — Patient Instructions (Signed)
Debrided painful corn. May benefit from office procedure, osteotripsy 2nd toe left. 45 min.

## 2015-01-16 NOTE — Progress Notes (Signed)
Subjective 54 year old male patient presents complaining of pain at 2nd toe left foot. The inflamed corn was trimmed 3 weeks ago and now the pain is back.   Objective: Thick build up corn with old bleeding in center at lateral aspect of PIPJ 2nd digit left. Enlarged phalangeal head 2nd Proximal phalanx left.  Normal appearance on right hallux.   Assessment: Painful toe 2nd left.  Pre ulcerative digital corn 2nd left.  Abnormal bone growth proximal phalanx 2nd digit left.   Plan: Pre ulcerative digital corn debrided. Toe spacer placed between 2nd and 3rd digit left. Rx. Pain pills refilled.  Patient may return for Minimal incision surgery, Osteotripsy 2nd proximal phalanx in office.

## 2015-01-23 ENCOUNTER — Ambulatory Visit: Payer: BLUE CROSS/BLUE SHIELD | Admitting: Podiatry

## 2015-01-29 ENCOUNTER — Ambulatory Visit: Payer: BLUE CROSS/BLUE SHIELD | Admitting: Podiatry

## 2015-01-30 ENCOUNTER — Ambulatory Visit: Payer: BLUE CROSS/BLUE SHIELD | Admitting: Podiatry

## 2015-03-04 ENCOUNTER — Ambulatory Visit: Payer: BLUE CROSS/BLUE SHIELD | Admitting: Podiatry

## 2015-03-10 ENCOUNTER — Ambulatory Visit: Payer: BLUE CROSS/BLUE SHIELD | Admitting: Podiatry

## 2015-03-10 ENCOUNTER — Ambulatory Visit (INDEPENDENT_AMBULATORY_CARE_PROVIDER_SITE_OTHER): Payer: BLUE CROSS/BLUE SHIELD | Admitting: Podiatry

## 2015-03-10 ENCOUNTER — Inpatient Hospital Stay: Admission: RE | Admit: 2015-03-10 | Payer: Self-pay | Source: Ambulatory Visit

## 2015-03-10 ENCOUNTER — Encounter: Payer: Self-pay | Admitting: Podiatry

## 2015-03-10 DIAGNOSIS — M2042 Other hammer toe(s) (acquired), left foot: Secondary | ICD-10-CM | POA: Diagnosis not present

## 2015-03-10 DIAGNOSIS — M779 Enthesopathy, unspecified: Secondary | ICD-10-CM | POA: Diagnosis not present

## 2015-03-10 DIAGNOSIS — L97521 Non-pressure chronic ulcer of other part of left foot limited to breakdown of skin: Secondary | ICD-10-CM | POA: Diagnosis not present

## 2015-03-10 DIAGNOSIS — M7752 Other enthesopathy of left foot: Secondary | ICD-10-CM

## 2015-03-10 MED ORDER — OXYCODONE-ACETAMINOPHEN 5-325 MG PO TABS: 1.0000 | ORAL_TABLET | Freq: Three times a day (TID) | ORAL | Status: DC | PRN

## 2015-03-10 NOTE — Progress Notes (Signed)
Subjective: 54 year old male presents requesting removal of left foot 2nd toe lesion. It has had ulceration, swelling, and on going pain.   Objective: Porokeratotic lesion with soft tissue swelling at lateral aspect of the PIPJ 2nd toe left with history of ulceration and inflammation. X-ray AP and MO left foot show soft tissue enlargement at lateral aspect of the PIPJ 2nd left. No excess spurring at the affected area.  Positive of old surgery with cerclage wire at the proximal phalanx of hallux, internal fixation screw at the first and 2nd Metatarsal head noted.   Assessment: Enlarged PIPJ with porokeratotic lesion and with history of ulceration and on going pain.   Procedure done: Excision lesion and removal of enlarged phalangeal bone from lateral aspect of the PIPJ 2nd toe left foot.  Local used: Total 26ml of 50/50 mixture 0.5% Marcaine plain and 1% Xylocaine plain. The lesion was excised with semi elliptical oblique incision, 1.2 cm long and 0.4cm apart in center. Lateral phalangeal surface was exposed and excess protruding bone was rasped smooth with power rasp. Area well flushed with sterile saline. Incision closed with 5/0 Nylon. Home care instruction and return appointment dispensed.

## 2015-03-10 NOTE — Patient Instructions (Signed)
Office surgery 2nd toe left, excision lesion and spur removal. Keep bandage dry. Take medication as instructed. Return in one week.

## 2015-03-11 ENCOUNTER — Telehealth: Payer: Self-pay | Admitting: *Deleted

## 2015-03-11 NOTE — Telephone Encounter (Signed)
03/11/15 Dr. Caffie Pinto, Patient called and left a message and states he is in tremendous pain, Can you give him something else for pain please

## 2015-03-17 ENCOUNTER — Ambulatory Visit (INDEPENDENT_AMBULATORY_CARE_PROVIDER_SITE_OTHER): Payer: BLUE CROSS/BLUE SHIELD | Admitting: Podiatry

## 2015-03-17 ENCOUNTER — Encounter: Payer: Self-pay | Admitting: Podiatry

## 2015-03-17 VITALS — BP 197/110 | HR 78

## 2015-03-17 DIAGNOSIS — Z9889 Other specified postprocedural states: Secondary | ICD-10-CM

## 2015-03-17 MED ORDER — HYDROCODONE-IBUPROFEN 7.5-200 MG PO TABS: 1.0000 | ORAL_TABLET | ORAL | Status: DC | PRN

## 2015-03-17 NOTE — Progress Notes (Signed)
1 week post op following resection of bone from IPJ medial aspect 2nd digit left.  Patient complained of much pain. Stated that he is not able to take the prescribed pain medication due to stomach problem. Stated that he flushed them to toilet and requiring different pain medication. Wound is clean and healing well. Dressing changed. New pain medication prescribed.

## 2015-03-17 NOTE — Patient Instructions (Addendum)
Post op wound clean and healing well. Return in one week.  Take pain medication 2 or 3 a day at the most.

## 2015-03-24 ENCOUNTER — Encounter: Payer: BLUE CROSS/BLUE SHIELD | Admitting: Podiatry

## 2015-04-02 ENCOUNTER — Encounter: Payer: Self-pay | Admitting: Podiatry

## 2015-04-02 ENCOUNTER — Ambulatory Visit (INDEPENDENT_AMBULATORY_CARE_PROVIDER_SITE_OTHER): Payer: BLUE CROSS/BLUE SHIELD | Admitting: Podiatry

## 2015-04-02 DIAGNOSIS — Z9889 Other specified postprocedural states: Secondary | ICD-10-CM

## 2015-04-02 MED ORDER — OXYCODONE-ACETAMINOPHEN 5-325 MG PO TABS: 1.0000 | ORAL_TABLET | Freq: Three times a day (TID) | ORAL | Status: DC | PRN

## 2015-04-02 NOTE — Patient Instructions (Signed)
Post op sutures removed. Use toe pad as needed. May benefit from a new pair orthotics. Will call with benefit info.

## 2015-04-02 NOTE — Progress Notes (Signed)
Status post 2nd toe removal of partial bone and skin. Stated that both feet are hurting bad. Patient also request a new pair orthotics.  Post op hammer toe 2nd left healed well. Suture removed. Toe pad dispensed. Return as needed. Will get orthotic benefit info.

## 2015-05-04 ENCOUNTER — Ambulatory Visit: Payer: BLUE CROSS/BLUE SHIELD | Admitting: Podiatry

## 2015-05-19 ENCOUNTER — Encounter: Payer: Self-pay | Admitting: Podiatry

## 2015-05-19 ENCOUNTER — Ambulatory Visit (INDEPENDENT_AMBULATORY_CARE_PROVIDER_SITE_OTHER): Payer: BLUE CROSS/BLUE SHIELD | Admitting: Podiatry

## 2015-05-19 VITALS — BP 144/97 | HR 73

## 2015-05-19 DIAGNOSIS — M79606 Pain in leg, unspecified: Secondary | ICD-10-CM

## 2015-05-19 DIAGNOSIS — M21619 Bunion of unspecified foot: Secondary | ICD-10-CM | POA: Diagnosis not present

## 2015-05-19 DIAGNOSIS — M722 Plantar fascial fibromatosis: Secondary | ICD-10-CM | POA: Diagnosis not present

## 2015-05-19 MED ORDER — OXYCODONE-ACETAMINOPHEN 5-325 MG PO TABS: 1.0000 | ORAL_TABLET | Freq: Three times a day (TID) | ORAL | Status: DC | PRN

## 2015-05-19 NOTE — Progress Notes (Signed)
Subjective 55 year old patient presents complaining of frequent pain and swelling in right bunion and right heel. Also having skin build up at the 2nd toe left where spur was removed. Patient wants medication refilled today.  Objective: Pain in right heel and arch. No edema or erythema noted. Severe Hallux valgus with bunion on right foot. Thick build up corn in medial aspect of PIPJ 2nd digit left.  Assessment: Chronic pain right bunion. Plantar fasciitis right heel.  Painful residual corn 2nd left.   Plan: Skin lesion debrided 2nd left and padded with Tubefoam gauze.  May benefit from a new Financial trader. Rx. Pain pills refilled.

## 2015-05-19 NOTE — Patient Instructions (Signed)
Seen for pain in right heel and bunion, and 2nd toe left. Painful corn debrided. May benefit from [employer].

## 2015-06-16 ENCOUNTER — Ambulatory Visit (INDEPENDENT_AMBULATORY_CARE_PROVIDER_SITE_OTHER): Payer: BLUE CROSS/BLUE SHIELD | Admitting: Podiatry

## 2015-06-16 ENCOUNTER — Encounter: Payer: Self-pay | Admitting: Podiatry

## 2015-06-16 VITALS — BP 199/118 | HR 81

## 2015-06-16 DIAGNOSIS — M79606 Pain in leg, unspecified: Secondary | ICD-10-CM

## 2015-06-16 DIAGNOSIS — M722 Plantar fascial fibromatosis: Secondary | ICD-10-CM

## 2015-06-16 MED ORDER — OXYCODONE-ACETAMINOPHEN 5-325 MG PO TABS: 1.0000 | ORAL_TABLET | Freq: Three times a day (TID) | ORAL | Status: DC | PRN

## 2015-06-16 NOTE — Patient Instructions (Signed)
Seen for painful feet. Medication prescribed. Cut down on work hour may help.

## 2015-06-16 NOTE — Progress Notes (Signed)
Subjective 55 year old patient presents complaining of pain in heels and arches of both feet duration of over a month.  On feet about 10 hours a day.   Objective: Pain in right heel and arch. No edema or erythema noted. Severe Hallux valgus with bunion on right foot. Thick build up corn in medial aspect of PIPJ 2nd digit left.  Assessment: Chronic pain right bunion. Plantar fasciitis right heel.  Painful residual corn 2nd left.   Plan: Skin lesion debrided 2nd left and padded with Tubefoam gauze.  May benefit from a new Financial trader. Rx. Pain pills refilled.

## 2015-07-27 ENCOUNTER — Ambulatory Visit (INDEPENDENT_AMBULATORY_CARE_PROVIDER_SITE_OTHER): Payer: BLUE CROSS/BLUE SHIELD | Admitting: Podiatry

## 2015-07-27 ENCOUNTER — Encounter: Payer: Self-pay | Admitting: Podiatry

## 2015-07-27 VITALS — BP 214/125 | HR 72

## 2015-07-27 DIAGNOSIS — M10071 Idiopathic gout, right ankle and foot: Secondary | ICD-10-CM

## 2015-07-27 DIAGNOSIS — M25572 Pain in left ankle and joints of left foot: Secondary | ICD-10-CM | POA: Diagnosis not present

## 2015-07-27 DIAGNOSIS — M21619 Bunion of unspecified foot: Secondary | ICD-10-CM | POA: Diagnosis not present

## 2015-07-27 DIAGNOSIS — M659 Synovitis and tenosynovitis, unspecified: Secondary | ICD-10-CM

## 2015-07-27 DIAGNOSIS — M65979 Unspecified synovitis and tenosynovitis, unspecified ankle and foot: Secondary | ICD-10-CM

## 2015-07-27 MED ORDER — OXYCODONE-ACETAMINOPHEN 5-325 MG PO TABS: 1.0000 | ORAL_TABLET | Freq: Three times a day (TID) | ORAL | Status: DC | PRN

## 2015-07-27 NOTE — Progress Notes (Signed)
Subjective 55 year old patient presents complaining of pain in right mid lateral aspect with swelling in ankle for duration of 3 weeks. Pain is severe and affects him walking. Pain is at level 10 of 10. On feet about 10 hours a day.   Objective: Pain in right 5th metatarsal base. Mild erythema and edema at lateral aspect at 5th metatarsal base.  Severe Hallux valgus with bunion on right. Excess sagittal plane motion of the first ray right.   Assessment: HAV with bunion right. Forefoot varus right. Hypermobile first ray right. Abnormal weight shifting to lateral column right. Pain, swelling with inflammation 5th metatarsal base right.  Tight Achilles tendon right.  R/O Gouty arthritis.  Plan: Reviewed clinical findings and available treatment options.  Requires a procedure that will stabilize the first ray, Lapidus fusion.  May benefit from a new Financial trader. Rx. Pain pills refilled.

## 2015-07-27 NOTE — Patient Instructions (Signed)
Pain and swelling on right foot. Abnormal weight shifting to lateral column due to weak first metatarsal bone. May benefit from surgical intervention to stabilize the first ray right, Lapidus fusion right. Also need custom orthotics. May take pain pill as needed in the meantime.  Return for pre op pending work schedule.

## 2015-08-21 ENCOUNTER — Encounter (HOSPITAL_BASED_OUTPATIENT_CLINIC_OR_DEPARTMENT_OTHER): Payer: Self-pay | Admitting: *Deleted

## 2015-08-21 DIAGNOSIS — M79672 Pain in left foot: Secondary | ICD-10-CM | POA: Insufficient documentation

## 2015-08-21 DIAGNOSIS — Z79891 Long term (current) use of opiate analgesic: Secondary | ICD-10-CM | POA: Diagnosis not present

## 2015-08-21 DIAGNOSIS — F329 Major depressive disorder, single episode, unspecified: Secondary | ICD-10-CM | POA: Insufficient documentation

## 2015-08-21 NOTE — ED Notes (Signed)
Left foot pain that started yesterday.  Pt reports that it feels like gout.

## 2015-08-22 ENCOUNTER — Emergency Department (HOSPITAL_BASED_OUTPATIENT_CLINIC_OR_DEPARTMENT_OTHER)
Admission: EM | Admit: 2015-08-22 | Discharge: 2015-08-22 | Disposition: A | Discharge status: Home / Self Care | Payer: BLUE CROSS/BLUE SHIELD | Attending: Emergency Medicine | Admitting: Emergency Medicine

## 2015-08-22 DIAGNOSIS — M109 Gout, unspecified: Secondary | ICD-10-CM

## 2015-08-22 MED ORDER — PREDNISONE 10 MG (21) PO TBPK: 10.0000 mg | ORAL_TABLET | Freq: Every day | ORAL | Status: DC

## 2015-08-22 MED ORDER — KETOROLAC TROMETHAMINE 30 MG/ML IJ SOLN
30.0000 mg | Freq: Once | INTRAMUSCULAR | Status: AC
  Administered 2015-08-22: 30 mg via INTRAMUSCULAR
  Filled 2015-08-22: qty 1

## 2015-08-22 NOTE — Discharge Instructions (Signed)
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
## 2015-09-09 ENCOUNTER — Encounter: Payer: Self-pay | Admitting: Podiatry

## 2015-09-09 ENCOUNTER — Ambulatory Visit (INDEPENDENT_AMBULATORY_CARE_PROVIDER_SITE_OTHER): Payer: BLUE CROSS/BLUE SHIELD | Admitting: Podiatry

## 2015-09-09 DIAGNOSIS — L97521 Non-pressure chronic ulcer of other part of left foot limited to breakdown of skin: Secondary | ICD-10-CM | POA: Diagnosis not present

## 2015-09-09 DIAGNOSIS — M79606 Pain in leg, unspecified: Secondary | ICD-10-CM | POA: Diagnosis not present

## 2015-09-09 MED ORDER — OXYCODONE-ACETAMINOPHEN 5-325 MG PO TABS: 1.0000 | ORAL_TABLET | Freq: Three times a day (TID) | ORAL | Status: DC | PRN

## 2015-09-09 NOTE — Patient Instructions (Signed)
Seen for pin in left 2nd toe lesion. Debrided and padded with home care instruction. Return as needed.

## 2015-09-09 NOTE — Progress Notes (Signed)
Subjective 55 year old patient presents complaining of pain in 2nd digit left foot with recurrent digital corn at lateral PIPJ.  Pain is so severe, hardly can walk.   Objective: Thick round digital corn PIPJ lateral aspect 2nd digit left.  Valgus positioned left hallux with medially deviated 3rd digit resuling lateral compression on 2nd digit left foot with lesion formaton. Severe Hallux valgus with bunion on right. Excess sagittal plane motion of the first ray right.   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 Abnormal weight shifting to lateral column right. Severe digital corn with pain 2nd digit PIPJ lateral, recurrent lesion. Tight Achilles tendon right.   Plan: Reviewed clinical findings and available treatment options.  Area debrided 2nd left with spacer and instruction.  Spare pad dispensed x 6. Rx. Pain pills refilled

## 2015-10-06 ENCOUNTER — Encounter: Payer: Self-pay | Admitting: Podiatry

## 2015-10-06 ENCOUNTER — Ambulatory Visit (INDEPENDENT_AMBULATORY_CARE_PROVIDER_SITE_OTHER): Payer: BLUE CROSS/BLUE SHIELD | Admitting: Podiatry

## 2015-10-06 DIAGNOSIS — M25572 Pain in left ankle and joints of left foot: Secondary | ICD-10-CM

## 2015-10-06 DIAGNOSIS — M722 Plantar fascial fibromatosis: Secondary | ICD-10-CM

## 2015-10-06 MED ORDER — OXYCODONE-ACETAMINOPHEN 7.5-325 MG PO TABS: 1.0000 | ORAL_TABLET | Freq: Four times a day (QID) | ORAL | Status: DC | PRN

## 2015-10-06 MED ORDER — OXYCODONE-ACETAMINOPHEN 5-325 MG PO TABS: 1.0000 | ORAL_TABLET | Freq: Three times a day (TID) | ORAL | Status: DC | PRN

## 2015-10-06 NOTE — Patient Instructions (Signed)
Seen for pain in right foot. Work excuse written. Rx prescribed for pain.

## 2015-10-06 NOTE — Progress Notes (Signed)
Subjective 55 year old patient presents complaining of pain in right foot. The bottom of right foot has been swollen and have difficulty walking. Needed a few days off from work. Pain is so severe, hardly can walk.   Objective: Neurovascular status are within normal. Mild edema right foot. Valgus positioned left hallux with medially deviated 3rd digit resuling lateral compression on 2nd digit left foot with lesion formaton.  Severe Hallux valgus with bunion on right. Excess sagittal plane motion of the first ray right.   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 Abnormal weight shifting to lateral column right. Severe digital corn with pain 2nd digit PIPJ lateral, recurrent lesion. Tight Achilles tendon right.   Plan: Reviewed clinical findings and available treatment options.  Advised to stay off of foot for a week. Work excuse dispensed. Rx. Pain pills refilled

## 2015-11-26 HISTORY — PX: OTHER SURGICAL HISTORY: SHX169

## 2016-05-17 ENCOUNTER — Ambulatory Visit (INDEPENDENT_AMBULATORY_CARE_PROVIDER_SITE_OTHER): Payer: BLUE CROSS/BLUE SHIELD | Admitting: Podiatry

## 2016-05-17 ENCOUNTER — Encounter: Payer: Self-pay | Admitting: Podiatry

## 2016-05-17 DIAGNOSIS — M2041 Other hammer toe(s) (acquired), right foot: Secondary | ICD-10-CM | POA: Diagnosis not present

## 2016-05-17 DIAGNOSIS — M79671 Pain in right foot: Secondary | ICD-10-CM | POA: Diagnosis not present

## 2016-05-17 DIAGNOSIS — M2011 Hallux valgus (acquired), right foot: Secondary | ICD-10-CM

## 2016-05-17 DIAGNOSIS — M21619 Bunion of unspecified foot: Secondary | ICD-10-CM

## 2016-05-17 MED ORDER — OXYCODONE-ACETAMINOPHEN 7.5-325 MG PO TABS: 1.0000 | ORAL_TABLET | Freq: Two times a day (BID) | ORAL | 0 refills | Status: DC | PRN

## 2016-05-17 NOTE — Patient Instructions (Signed)
Seen for pain in right foot. Discussed possible surgical option next month.

## 2016-05-17 NOTE — Progress Notes (Signed)
Subjective 56 year old patient presents complaining of pain in right foot. Patient points bunion, the first and 2nd digits of right foot. It has been swollen and been hurting. Pain is so severe, hardly can walk. Patient request surgical options.  Objective: Neurovascular status are within normal. Vascular: All pedal pulses are palpable. No edema or erythema noted. Orthopedic: Valgus positioned left hallux with medially deviated 3rd digit resuling lateral compression on 2nd digit left foot with lesion formaton.  Severe Hallux valgus with bunion on right. Excess sagittal plane motion of the first ray right.   Radiographic examination of the right foot reveal irregular joint space of IPJ hallux with lateral deviation of IPJ. Enlarged medial eminence of first metatarsal, long 2nd digit right foot.   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 Abnormal weight shifting to lateral column right. Severe digital corn with pain 2nd digit PIPJ lateral, recurrent lesion. Tight Achilles tendon right.   Plan: Reviewed clinical findings and available treatment options.  As per request, surgery consent form reviewed for [facility] bunionectomy, IPJ fusion great toe, Hammer toe repair 2nd toe right foot.  Rx. Pain pills refilled

## 2016-06-21 ENCOUNTER — Encounter: Payer: Self-pay | Admitting: Podiatry

## 2016-06-21 ENCOUNTER — Ambulatory Visit (INDEPENDENT_AMBULATORY_CARE_PROVIDER_SITE_OTHER): Payer: BLUE CROSS/BLUE SHIELD | Admitting: Podiatry

## 2016-06-21 DIAGNOSIS — M659 Synovitis and tenosynovitis, unspecified: Secondary | ICD-10-CM

## 2016-06-21 DIAGNOSIS — M79671 Pain in right foot: Secondary | ICD-10-CM | POA: Diagnosis not present

## 2016-06-21 DIAGNOSIS — M65979 Unspecified synovitis and tenosynovitis, unspecified ankle and foot: Secondary | ICD-10-CM

## 2016-06-21 DIAGNOSIS — M21619 Bunion of unspecified foot: Secondary | ICD-10-CM

## 2016-06-21 MED ORDER — OXYCODONE-ACETAMINOPHEN 7.5-325 MG PO TABS: 1.0000 | ORAL_TABLET | Freq: Two times a day (BID) | ORAL | 0 refills | Status: DC | PRN

## 2016-06-21 NOTE — Patient Instructions (Signed)
Seen for pain in right foot. Pain medication prescribed. Return as needed.

## 2016-06-21 NOTE — Progress Notes (Signed)
Subjective 56 year old patient presents complaining of pain in right foot.  Stated that he is very close to have surgery. At this time he wants to have pain medication prescribed. No new changes since the last visit. Patient points bunion, the first and 2nd digits of right foot. It has been swollen and been hurting. Pain is so severe, hardly can walk. Patient request surgical options.  Objective: Neurovascular status are within normal. Vascular: All pedal pulses are palpable. No edema or erythema noted. Orthopedic: Valgus positioned left hallux with medially deviated 3rd digit resuling lateral compression on 2nd digit left foot with lesion formaton.  Severe Hallux valgus with bunion on right. Excess sagittal plane motion of the first ray right.   Radiographic examination of the right foot reveal irregular joint space of IPJ hallux with lateral deviation of IPJ. Enlarged medial eminence of first metatarsal, long 2nd digit right foot.   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 Abnormal weight shifting to lateral column right. Severe digital corn with pain 2nd digit PIPJ lateral, recurrent lesion. Tight Achilles tendon right.   Plan: Reviewed clinical findings and available treatment options.  As per request, Rx. Pain pills refilled. Return in 2 weeks for pre op.

## 2016-07-21 ENCOUNTER — Encounter: Payer: Self-pay | Admitting: Podiatry

## 2016-07-21 ENCOUNTER — Ambulatory Visit (INDEPENDENT_AMBULATORY_CARE_PROVIDER_SITE_OTHER): Payer: BLUE CROSS/BLUE SHIELD | Admitting: Podiatry

## 2016-07-21 DIAGNOSIS — M21619 Bunion of unspecified foot: Secondary | ICD-10-CM | POA: Diagnosis not present

## 2016-07-21 DIAGNOSIS — M79671 Pain in right foot: Secondary | ICD-10-CM | POA: Diagnosis not present

## 2016-07-21 DIAGNOSIS — M2041 Other hammer toe(s) (acquired), right foot: Secondary | ICD-10-CM

## 2016-07-21 MED ORDER — OXYCODONE-ACETAMINOPHEN 5-325 MG PO TABS: 1.0000 | ORAL_TABLET | Freq: Three times a day (TID) | ORAL | 0 refills | Status: DC | PRN

## 2016-07-21 NOTE — Patient Instructions (Signed)
Seen for pain in right foot. Debrided build up callus. Rx given for Percocet 5/325.

## 2016-07-21 NOTE — Progress Notes (Signed)
Subjective 56 year old patient presents complaining of pain in right foot.  Patient request for pain medication. Patient wants to have surgery done in May 2018. Patient points bunion, the first and 2nd digits of right foot being the source of foot pain.   Objective: Neurovascular status are within normal. Vascular: All pedal pulses are palpable. No edema or erythema noted. Orthopedic: Valgus positioned left hallux with medially deviated 3rd digit resuling lateral compression on 2nd digit left foot with lesion formaton.  Severe Hallux valgus with bunion on right. Excess sagittal plane motion of the first ray right.   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 Abnormal weight shifting to lateral column right. Severe digital corn with pain 2nd digit PIPJ lateral, recurrent lesion. Tight Achilles tendon right.   Plan: Reviewed clinical findings and available treatment options, possible STJ arthroereisis, Fusion first MCJ, and McBride with +/- Hammer toe repair.  As per request, Rx. Pain pills refilled. Return in May for pre op.

## 2016-11-01 ENCOUNTER — Encounter: Payer: Self-pay | Admitting: Podiatry

## 2016-11-01 ENCOUNTER — Ambulatory Visit (INDEPENDENT_AMBULATORY_CARE_PROVIDER_SITE_OTHER): Payer: BLUE CROSS/BLUE SHIELD | Admitting: Podiatry

## 2016-11-01 VITALS — BP 210/122 | HR 78 | Ht 73.0 in | Wt 210.0 lb

## 2016-11-01 DIAGNOSIS — M659 Synovitis and tenosynovitis, unspecified: Secondary | ICD-10-CM

## 2016-11-01 DIAGNOSIS — M21619 Bunion of unspecified foot: Secondary | ICD-10-CM

## 2016-11-01 DIAGNOSIS — M65979 Unspecified synovitis and tenosynovitis, unspecified ankle and foot: Secondary | ICD-10-CM

## 2016-11-01 DIAGNOSIS — M2041 Other hammer toe(s) (acquired), right foot: Secondary | ICD-10-CM | POA: Diagnosis not present

## 2016-11-01 DIAGNOSIS — M79671 Pain in right foot: Secondary | ICD-10-CM | POA: Diagnosis not present

## 2016-11-01 MED ORDER — HYDROCODONE-IBUPROFEN 7.5-200 MG PO TABS: 1.0000 | ORAL_TABLET | Freq: Four times a day (QID) | ORAL | 0 refills | Status: DC | PRN

## 2016-11-01 NOTE — Patient Instructions (Addendum)
Seen for pain in right foot. Was not able to work for 2 days. Work excuse given. Pain medication prescribed.

## 2016-11-01 NOTE — Progress Notes (Signed)
Subjective 56 year old patient presents complaining of pain all over in right foot. He was not able to report to work for the last 2 days due to foot pain. Patient request for pain medication also.   Stated that entire right foot including bunion, the first and 2nd digits, lateral column and the top of the foot being the source of the pain.   Objective: Neurovascular status are within normal. Vascular: All pedal pulses are palpable. No edema or erythema noted. Orthopedic: Valgus positioned left hallux with medially deviated 3rd digit resuling lateral compression on 2nd digit left foot with lesion formaton.  Severe Hallux valgus with bunion on right. Excess sagittal plane motion of the first ray right.   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 Abnormal weight shifting to lateral column right. Severe digital corn with pain 2nd digit PIPJ lateral, recurrent lesion. Tight Achilles tendon right.   Plan: Reviewed clinical findings and available treatment options, possible STJ arthroereisis, Fusion first MCJ, and McBride with +/- Hammer toe repair.  As per request, Rx. Pain pills refilled. Work excuse dispensed. Return as needed.

## 2016-11-10 ENCOUNTER — Ambulatory Visit (INDEPENDENT_AMBULATORY_CARE_PROVIDER_SITE_OTHER): Payer: BLUE CROSS/BLUE SHIELD | Admitting: Podiatry

## 2016-11-10 ENCOUNTER — Encounter: Payer: Self-pay | Admitting: Podiatry

## 2016-11-10 DIAGNOSIS — M79671 Pain in right foot: Secondary | ICD-10-CM

## 2016-11-10 DIAGNOSIS — M2041 Other hammer toe(s) (acquired), right foot: Secondary | ICD-10-CM

## 2016-11-10 DIAGNOSIS — M21619 Bunion of unspecified foot: Secondary | ICD-10-CM

## 2016-11-10 MED ORDER — OXYCODONE-ACETAMINOPHEN 5-325 MG PO TABS: 1.0000 | ORAL_TABLET | Freq: Four times a day (QID) | ORAL | 0 refills | Status: DC | PRN

## 2016-11-10 MED ORDER — NABUMETONE 500 MG PO TABS: 500.0000 mg | ORAL_TABLET | Freq: Two times a day (BID) | ORAL | 1 refills | Status: DC

## 2016-11-10 NOTE — Progress Notes (Signed)
Subjective 56year old patient presents complaining of pain all over in right foot and request for surgical intervention. Patient request for pain medication also.  Stated that entire right foot including bunion, the first and 2nd digits, lateral column and the top of the foot being the source of the pain.   Objective: Neurovascular status are within normal. Vascular: All pedal pulses are palpable. No edema or erythema noted. Orthopedic: Bunion deformity left, Valgus positioned left hallux, contracted hammer toe 2nd, medially deviated 3rd digit. Excess sagittal plane motion of the first ray right.   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 Abnormal weight shifting to lateral column right. Severe digital corn with pain 2nd digit PIPJ lateral, recurrent lesion. Long 2nd metatarsal right. Tight Achilles tendon right.   Plan: Reviewed clinical findings and available treatment options, possible STJ arthroereisis, Fusion first MCJ, and McBride with +/- Hammer toe repair.  Surgery consent form was reviewed for [facility] bunionectomy, Distal Akin osteotomy, Metatarsal osteotomy 2nd, and hammer toe repair 2nd right. As per request, Rx. Pain pills refilled.

## 2016-11-10 NOTE — Patient Instructions (Addendum)
Seen for pain in right foot. Pre op paper reviewed. Prescription dispensed. Surgery consent form signed for [facility] bunionectomy, Akin osteotomy of great toe, metatarsal osteotomy 2nd and hammer toe repair 2nd all right foot.

## 2016-11-23 ENCOUNTER — Other Ambulatory Visit: Payer: Self-pay | Admitting: Podiatry

## 2016-11-23 ENCOUNTER — Telehealth: Payer: Self-pay | Admitting: *Deleted

## 2016-11-23 MED ORDER — OXYCODONE-ACETAMINOPHEN 7.5-325 MG PO TABS: 1.0000 | ORAL_TABLET | Freq: Four times a day (QID) | ORAL | 0 refills | Status: DC | PRN

## 2016-11-23 MED ORDER — NABUMETONE 500 MG PO TABS: 500.0000 mg | ORAL_TABLET | Freq: Two times a day (BID) | ORAL | 1 refills | Status: AC

## 2016-11-23 NOTE — Telephone Encounter (Signed)
11/23/16 Patient request a RX for Foot pain today.

## 2016-11-24 ENCOUNTER — Ambulatory Visit: Payer: BLUE CROSS/BLUE SHIELD | Admitting: Podiatry

## 2016-11-25 DIAGNOSIS — M2011 Hallux valgus (acquired), right foot: Secondary | ICD-10-CM | POA: Diagnosis not present

## 2016-11-25 DIAGNOSIS — M205X1 Other deformities of toe(s) (acquired), right foot: Secondary | ICD-10-CM | POA: Diagnosis not present

## 2016-11-25 DIAGNOSIS — M2041 Other hammer toe(s) (acquired), right foot: Secondary | ICD-10-CM | POA: Diagnosis not present

## 2016-11-25 DIAGNOSIS — M216X1 Other acquired deformities of right foot: Secondary | ICD-10-CM | POA: Diagnosis not present

## 2016-11-25 HISTORY — PX: AIKEN OSTEOTOMY: SHX6331

## 2016-11-25 HISTORY — PX: METATARSAL OSTEOTOMY: SHX1641

## 2016-11-25 HISTORY — PX: OTHER SURGICAL HISTORY: SHX169

## 2016-11-28 ENCOUNTER — Ambulatory Visit (INDEPENDENT_AMBULATORY_CARE_PROVIDER_SITE_OTHER): Payer: BLUE CROSS/BLUE SHIELD | Admitting: Podiatry

## 2016-11-28 ENCOUNTER — Encounter: Payer: Self-pay | Admitting: Podiatry

## 2016-11-28 DIAGNOSIS — Z9889 Other specified postprocedural states: Secondary | ICD-10-CM

## 2016-11-28 MED ORDER — OXYCODONE-ACETAMINOPHEN 7.5-325 MG PO TABS: 1.0000 | ORAL_TABLET | Freq: Four times a day (QID) | ORAL | 0 refills | Status: DC | PRN

## 2016-11-28 MED ORDER — ALLOPURINOL 300 MG PO TABS: 300.0000 mg | ORAL_TABLET | Freq: Every day | ORAL | 6 refills | Status: AC

## 2016-11-28 MED ORDER — AMOXICILLIN-POT CLAVULANATE 875-125 MG PO TABS
1.0000 | ORAL_TABLET | Freq: Two times a day (BID) | ORAL | 0 refills | Status: AC
Start: 2016-11-28 — End: ?

## 2016-11-28 NOTE — Patient Instructions (Signed)
3 day post op wound check. Noted of excess bleeding and redness over incision area. Pain medication, antibiotics, anti gout medication prescribed. Return in one week or come sooner if pain and swelling increase.

## 2016-11-28 NOTE — Progress Notes (Signed)
3 day post op wound check.  He needed a prescription for pain. Last prescription was filled only for 5 days by pharmacist. Noted of excess bleeding and redness over incision area of right great toe. Pain medication, antibiotics, anti gout medication prescribed. Return in one week or come sooner if pain and swelling increase.

## 2016-11-30 ENCOUNTER — Encounter: Payer: BLUE CROSS/BLUE SHIELD | Admitting: Podiatry

## 2016-11-30 ENCOUNTER — Ambulatory Visit: Payer: BLUE CROSS/BLUE SHIELD | Admitting: Podiatry

## 2016-12-01 ENCOUNTER — Telehealth: Payer: Self-pay | Admitting: *Deleted

## 2016-12-01 NOTE — Telephone Encounter (Signed)
11/30/16 Patient called and said he will be needing another RX for pain.

## 2016-12-05 ENCOUNTER — Ambulatory Visit (INDEPENDENT_AMBULATORY_CARE_PROVIDER_SITE_OTHER): Payer: BLUE CROSS/BLUE SHIELD | Admitting: Podiatry

## 2016-12-05 ENCOUNTER — Encounter: Payer: Self-pay | Admitting: Podiatry

## 2016-12-05 DIAGNOSIS — M21619 Bunion of unspecified foot: Secondary | ICD-10-CM

## 2016-12-05 DIAGNOSIS — M2041 Other hammer toe(s) (acquired), right foot: Secondary | ICD-10-CM | POA: Diagnosis not present

## 2016-12-05 MED ORDER — OXYCODONE-ACETAMINOPHEN 5-325 MG PO TABS: 1.0000 | ORAL_TABLET | Freq: Four times a day (QID) | ORAL | 0 refills | Status: AC | PRN

## 2016-12-05 NOTE — Progress Notes (Signed)
10 day post op wound check following [place] bunionectomy, Akin osteotomy, 2nd metatarsal osteotomy, 2nd HT repair right, 11/25/16. Stated that the foot is feeling much better now. Been taking medication as prescribed. Noted all incision sites are dry with no swelling or erythema.  Post op X-ray is consistent with surgery performed. All fixation hard wares are in place.  Osteotomy sites are well approximated. Pain medication re ordered since he only got 7 day supply with the last prescription.. Return in one week for suture removal.

## 2016-12-05 NOTE — Patient Instructions (Signed)
10 days post op visit. All swelling and redness subsided. Post op X-ray done. Rx re prescribed. Return in one week.

## 2016-12-07 ENCOUNTER — Encounter: Payer: BLUE CROSS/BLUE SHIELD | Admitting: Podiatry

## 2016-12-12 ENCOUNTER — Encounter: Payer: Self-pay | Admitting: Podiatry

## 2016-12-12 ENCOUNTER — Ambulatory Visit (INDEPENDENT_AMBULATORY_CARE_PROVIDER_SITE_OTHER): Payer: BLUE CROSS/BLUE SHIELD | Admitting: Podiatry

## 2016-12-12 DIAGNOSIS — Z9889 Other specified postprocedural states: Secondary | ICD-10-CM

## 2016-12-12 MED ORDER — HYDROCODONE-ACETAMINOPHEN 5-325 MG PO TABS: 1.0000 | ORAL_TABLET | Freq: Four times a day (QID) | ORAL | 0 refills | Status: AC | PRN

## 2016-12-12 MED ORDER — HYDROCODONE-IBUPROFEN 5-200 MG PO TABS: 1.0000 | ORAL_TABLET | Freq: Four times a day (QID) | ORAL | 0 refills | Status: DC | PRN

## 2016-12-12 NOTE — Patient Instructions (Signed)
Seen for post op check on right foot. Post op wound healing normal. Sutures removed. Ok to do light duty work. Return in one week.

## 2016-12-12 NOTE — Progress Notes (Signed)
17 day post op wound check following [place] bunionectomy, Akin osteotomy, 2nd metatarsal osteotomy, 2nd HT repair right, 11/25/16. Stated that the foot still feels good. Been taking medication as prescribed. Noted all incision sites are dry with no swelling or erythema.  Remaining suture removed from the 2nd digit right. Mefix tape placed with home care instruction. 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 Return in one week.

## 2016-12-15 ENCOUNTER — Telehealth: Payer: Self-pay | Admitting: *Deleted

## 2016-12-15 NOTE — Telephone Encounter (Signed)
12/15/16 PT CALLED THIS AFTERNOON AND SAID HE TRIPPED AND HIT HIS FOOT, HIS FOOT HS BEEN HURTING 2 DAYS, HE SAYS HIS CURRENT MEDICATION ISN'T HELPING AND CAN HE HAVE SOMETHING STRONGER.

## 2016-12-19 ENCOUNTER — Ambulatory Visit (INDEPENDENT_AMBULATORY_CARE_PROVIDER_SITE_OTHER): Payer: BLUE CROSS/BLUE SHIELD | Admitting: Podiatry

## 2016-12-19 ENCOUNTER — Encounter: Payer: Self-pay | Admitting: Podiatry

## 2016-12-19 DIAGNOSIS — Z9889 Other specified postprocedural states: Secondary | ICD-10-CM

## 2016-12-19 MED ORDER — HYDROCODONE-IBUPROFEN 7.5-200 MG PO TABS: 1.0000 | ORAL_TABLET | Freq: Three times a day (TID) | ORAL | 0 refills | Status: DC | PRN

## 2016-12-19 NOTE — Progress Notes (Signed)
3 weeks post op wound check following [place] bunionectomy, Akin osteotomy, 2nd metatarsal osteotomy, 2nd HT repair right, 11/25/16. Stated that he missed a step and hurt his foot 3 days ago. The foot has been hurting more.   Mild digital edema on 2nd right from compression stockinet. No other abnormal findings.  Assessment: Normal post op wound healing on right foot.  Plan: Keep the 2nd toe wrapped with Coban to control swelling. Reduce sea food to prevent gouty attack.  Return in 3 weeks.

## 2016-12-19 NOTE — Patient Instructions (Signed)
3 weeks post op wound. Healing well. Pain medication re ordered. Return in 3 weeks.

## 2017-01-05 ENCOUNTER — Encounter: Payer: Self-pay | Admitting: Podiatry

## 2017-01-05 ENCOUNTER — Ambulatory Visit (INDEPENDENT_AMBULATORY_CARE_PROVIDER_SITE_OTHER): Payer: BLUE CROSS/BLUE SHIELD | Admitting: Podiatry

## 2017-01-05 DIAGNOSIS — Z9889 Other specified postprocedural states: Secondary | ICD-10-CM

## 2017-01-05 MED ORDER — HYDROCODONE-IBUPROFEN 7.5-200 MG PO TABS: 1.0000 | ORAL_TABLET | Freq: Three times a day (TID) | ORAL | 0 refills | Status: DC | PRN

## 2017-01-05 NOTE — Progress Notes (Signed)
6 weeks post op wound check following [place] bunionectomy, Akin osteotomy, 2nd metatarsal osteotomy, 2nd HT repair right, 11/25/16.  Assessment: Normal post op wound healing on right foot.  Plan: Keep the 2nd toe wrapped with Coban to control swelling. Reduce sea food to prevent gouty attack.  Ok to return to work starting 01/07/17 for new job description.

## 2017-01-05 NOTE — Patient Instructions (Signed)
6 weeks post op wound healing normal. Ok to return to work.

## 2017-01-09 ENCOUNTER — Encounter: Payer: BLUE CROSS/BLUE SHIELD | Admitting: Podiatry

## 2017-01-11 ENCOUNTER — Telehealth: Payer: Self-pay | Admitting: *Deleted

## 2017-01-11 MED ORDER — HYDROCODONE-IBUPROFEN 7.5-200 MG PO TABS: 1.0000 | ORAL_TABLET | Freq: Three times a day (TID) | ORAL | 0 refills | Status: DC | PRN

## 2017-01-11 NOTE — Telephone Encounter (Signed)
01/11/17 Patient called this afternoon and ask can he get a RX for pain and pick up tomorrow, They would only fill 24 of the 50 on the Rx given last week

## 2017-01-26 ENCOUNTER — Encounter: Payer: BLUE CROSS/BLUE SHIELD | Admitting: Podiatry

## 2017-02-02 ENCOUNTER — Encounter: Payer: BLUE CROSS/BLUE SHIELD | Admitting: Podiatry

## 2017-02-08 ENCOUNTER — Encounter: Payer: BLUE CROSS/BLUE SHIELD | Admitting: Podiatry

## 2017-02-09 ENCOUNTER — Encounter: Payer: Self-pay | Admitting: Podiatry

## 2017-02-09 ENCOUNTER — Ambulatory Visit (INDEPENDENT_AMBULATORY_CARE_PROVIDER_SITE_OTHER): Payer: BLUE CROSS/BLUE SHIELD | Admitting: Podiatry

## 2017-02-09 DIAGNOSIS — Z9889 Other specified postprocedural states: Secondary | ICD-10-CM

## 2017-02-09 MED ORDER — HYDROCODONE-IBUPROFEN 7.5-200 MG PO TABS: 1.0000 | ORAL_TABLET | Freq: Three times a day (TID) | ORAL | 0 refills | Status: AC | PRN

## 2017-02-09 NOTE — Progress Notes (Signed)
10 weesk following [place] bunionectomy, Akin osteotomy, 2nd metatarsal osteotomy, 2nd HT repair right, 11/25/16. Been having swollen joint on left great toe for several days. Was not able to report to work since Monday, 02/06/17. Normal wound healing on right foot. Acute gouty arthropathy left first MPJ. Continue with Allopurinol 300 mg daily. May use pain medication as needed. Ok to wear regular shoe on right.  Return if pain continues.

## 2017-02-09 NOTE — Patient Instructions (Signed)
Normal post op wound healing on right. Acute gouty arthropathy left. [place] for regular shoe on right. Continue with Allopurinol. Return if pain continues.
